# Patient Record
Sex: Male | Born: 1935 | Race: White | Hispanic: No | Marital: Married | Smoking: Former smoker
Health system: Southern US, Community
[De-identification: ages and names within clinical notes are randomized; demographics above are authoritative.]

## PROBLEM LIST (undated history)

## (undated) DIAGNOSIS — E785 Hyperlipidemia, unspecified: Secondary | ICD-10-CM

## (undated) DIAGNOSIS — I4891 Unspecified atrial fibrillation: Secondary | ICD-10-CM

## (undated) DIAGNOSIS — N19 Unspecified kidney failure: Secondary | ICD-10-CM

## (undated) DIAGNOSIS — E119 Type 2 diabetes mellitus without complications: Secondary | ICD-10-CM

## (undated) DIAGNOSIS — I10 Essential (primary) hypertension: Secondary | ICD-10-CM

## (undated) HISTORY — DX: Type 2 diabetes mellitus without complications: E11.9

## (undated) HISTORY — DX: Essential (primary) hypertension: I10

## (undated) HISTORY — DX: Unspecified kidney failure: N19

## (undated) HISTORY — PX: CORONARY ANGIOPLASTY WITH STENT PLACEMENT: SHX49

## (undated) HISTORY — PX: TURP VAPORIZATION: SUR1397

## (undated) HISTORY — DX: Hyperlipidemia, unspecified: E78.5

---

## 2001-12-03 ENCOUNTER — Encounter: Payer: Self-pay | Admitting: Emergency Medicine

## 2001-12-03 ENCOUNTER — Emergency Department (HOSPITAL_COMMUNITY): Admission: EM | Admit: 2001-12-03 | Discharge: 2001-12-03 | Payer: Self-pay

## 2013-02-02 ENCOUNTER — Emergency Department (INDEPENDENT_AMBULATORY_CARE_PROVIDER_SITE_OTHER): Payer: Medicare Other

## 2013-02-02 ENCOUNTER — Encounter (HOSPITAL_COMMUNITY): Payer: Self-pay | Admitting: Emergency Medicine

## 2013-02-02 ENCOUNTER — Emergency Department (INDEPENDENT_AMBULATORY_CARE_PROVIDER_SITE_OTHER)
Admission: EM | Admit: 2013-02-02 | Discharge: 2013-02-02 | Disposition: A | Discharge status: Home / Self Care | Payer: Medicare Other | Source: Home / Self Care | Attending: Family Medicine | Admitting: Family Medicine

## 2013-02-02 DIAGNOSIS — J986 Disorders of diaphragm: Secondary | ICD-10-CM

## 2013-02-02 DIAGNOSIS — J069 Acute upper respiratory infection, unspecified: Secondary | ICD-10-CM

## 2013-02-02 HISTORY — DX: Unspecified atrial fibrillation: I48.91

## 2013-02-02 MED ORDER — AZITHROMYCIN 250 MG PO TABS: 250.0000 mg | ORAL_TABLET | Freq: Every day | ORAL | Status: DC

## 2013-02-02 MED ORDER — PROMETHAZINE-CODEINE 6.25-10 MG/5ML PO SYRP: 5.0000 mL | ORAL_SOLUTION | Freq: Four times a day (QID) | ORAL | Status: DC | PRN

## 2013-02-02 MED ORDER — IPRATROPIUM BROMIDE 0.03 % NA SOLN: 2.0000 | Freq: Two times a day (BID) | NASAL | Status: DC

## 2013-02-02 NOTE — ED Provider Notes (Signed)
Terrence [name] is a 79 y.o. male who presents to Urgent Care today for sore throat hoarseness and cough. This is been present for 2 days. Patient denies any significant shortness of breath. He denies any fevers or chills nausea vomiting or diarrhea. He feels well otherwise. He has tried 325 mg of Tylenol which did not help much. He denies any history of chest or neck surgery. He denies any fevers or chill. He feels well otherwise.  No history of right hemidiaphragm.    Past Medical History  Diagnosis Date  . Atrial fibrillation    History  Substance Use Topics  . Smoking status: Never Smoker   . Smokeless tobacco: Not on file  . Alcohol Use: No   ROS as above Medications reviewed. Patient takes warfarin No current facility-administered medications for this encounter.   Current Outpatient Prescriptions  Medication Sig Dispense Refill  . azithromycin (ZITHROMAX) 250 MG tablet Take 1 tablet (250 mg total) by mouth daily. Take first 2 tablets together, then 1 every day until finished.  6 tablet  0  . ipratropium (ATROVENT) 0.03 % nasal spray Place 2 sprays into the nose every 12 (twelve) hours.  30 mL  1  . promethazine-codeine (PHENERGAN WITH CODEINE) 6.25-10 MG/5ML syrup Take 5 mLs by mouth every 6 (six) hours as needed for cough.  120 mL  0    Exam:  BP 129/62  Pulse 82  Temp(Src) 98.2 F (36.8 C) (Oral)  Resp 19  SpO2 96% Gen: Well NAD HEENT: EOMI,  MMM, posterior and posterior pharynx. Normal tympanic membranes bilateral Lungs: Normal work of breathing. Decreased breath sounds right base normal otherwise. Heart: Irregularly irregular. No murmurs rubs or gallops. Abd: NABS, NT, ND Exts: Non edematous BL  LE, warm and well perfused.   No results found for this or any previous visit (from the past 24 hour(s)). Dg Chest 2 View  02/02/2013   CLINICAL DATA:  Cough, congestion  EXAM: CHEST  2 VIEW  COMPARISON:  None.  FINDINGS: Cardiomediastinal silhouette is unremarkable.  Significant elevation of the right hemidiaphragm. Mild right basilar atelectasis. No segmental infiltrate or pulmonary edema. Atherosclerotic calcifications of thoracic aorta.  IMPRESSION: No active disease. Significant elevation of the right hemidiaphragm with right basilar atelectasis. Atherosclerotic calcifications of thoracic aorta.   Electronically Signed   By: Natasha Mead M.D.   On: 02/02/2013 14:54    Assessment and Plan: 79 y.o. male with probable viral URI. Viral illnesses the most likely explanation of hoarseness nasal congestion and cough. However patient has a right hemidiaphragm. This is likely an incidental finding. I discussed the case with the patient's primary care provider who has no record of a previous x-ray.  Patient is clinically stable. He will followup with his primary care provider on Monday for further evaluation of his right hemidiaphragm. Additionally the meantime we'll treat his symptoms with Atrovent nasal spray azithromycin and codeine containing cough medication.   He'll present to the emergency room if he worsens.  Discussed warning signs or symptoms. Please see discharge instructions. Patient expresses understanding.     Rodolph Bong, MD 02/02/13 [phone]

## 2013-02-02 NOTE — ED Notes (Signed)
Pt c/o cold sxs onset Tuesday  Sxs include: hoareness, ST, decreased sleep due to productive cough Denies: SOB, wheezing, f/v/n/d Alert w/no signs of acute distress.

## 2013-02-26 ENCOUNTER — Ambulatory Visit (INDEPENDENT_AMBULATORY_CARE_PROVIDER_SITE_OTHER): Payer: Medicare Other | Admitting: Podiatry

## 2013-02-26 ENCOUNTER — Encounter: Payer: Self-pay | Admitting: Podiatry

## 2013-02-26 VITALS — BP 125/64 | HR 67 | Resp 12 | Ht 74.0 in | Wt 268.0 lb

## 2013-02-26 DIAGNOSIS — B351 Tinea unguium: Secondary | ICD-10-CM

## 2013-02-26 DIAGNOSIS — M79609 Pain in unspecified limb: Secondary | ICD-10-CM

## 2013-02-26 NOTE — Progress Notes (Signed)
Subjective:     Patient ID: Terrence [name], male   DOB: [birth year], 79 y.o.   MRN: [id number]  HPI patient is found to have nail disease with thickness and pain 1-5 both feet   Review of Systems     Objective:   Physical Exam Neurovascular status intact with thick nail disease and pain 1-5 both feet    Assessment:     Mycotic nail infection with pain 1-5 both feet    Plan:     Debridement painful nailbeds 1-5 both feet

## 2013-03-30 ENCOUNTER — Encounter: Payer: Self-pay | Admitting: Internal Medicine

## 2013-03-30 ENCOUNTER — Ambulatory Visit (INDEPENDENT_AMBULATORY_CARE_PROVIDER_SITE_OTHER): Payer: Medicare Other | Admitting: Internal Medicine

## 2013-03-30 VITALS — BP 140/72 | HR 86 | Temp 98.0°F | Ht 73.0 in | Wt 269.0 lb

## 2013-03-30 DIAGNOSIS — R0989 Other specified symptoms and signs involving the circulatory and respiratory systems: Secondary | ICD-10-CM

## 2013-03-30 DIAGNOSIS — R0600 Dyspnea, unspecified: Secondary | ICD-10-CM

## 2013-03-30 DIAGNOSIS — J986 Disorders of diaphragm: Secondary | ICD-10-CM

## 2013-03-30 DIAGNOSIS — R0609 Other forms of dyspnea: Secondary | ICD-10-CM

## 2013-03-30 DIAGNOSIS — I10 Essential (primary) hypertension: Secondary | ICD-10-CM

## 2013-03-30 MED ORDER — AMLODIPINE-OLMESARTAN 5-20 MG PO TABS: 1.0000 | ORAL_TABLET | Freq: Every day | ORAL | Status: AC

## 2013-03-30 NOTE — Patient Instructions (Addendum)
Please see patient coordinator before you leave today  to schedule fluoroscopy   Stop vasotec and amlopine   Start azor 5/20 one daily instead  Weight control is simply a matter of calorie balance which needs to be tilted in your favor by eating less and exercising more.  To get the most out of exercise, you need to be continuously aware that you are short of breath, but never out of breath, for 30 minutes daily. As you improve, it will actually be easier for you to do the same amount of exercise  in  30 minutes so always push to the level where you are short of breath.  If this does not result in gradual weight reduction then I strongly recommend you see a nutritionist with a food diary x 2 weeks so that we can work out a negative calorie balance which is universally effective in steady weight loss programs.  Think of your calorie balance like you do your bank account where in this case you want the balance to go down so you must take in less calories than you burn up.  It's just that simple:  Hard to do, but easy to understand.  Good luck!   Please schedule a follow up office visit in 6 weeks, call sooner if needed with pfts

## 2013-03-30 NOTE — Progress Notes (Signed)
   Subjective:    Patient ID: Terrence [name], male    DOB: Feb 19, 1927   MRN: [id number]  HPI  [age] yowm quit smoking 1969 around 200 lbs first aware of sob around 1998 eval by [facility] [place] [facility] 2009 with ? Eventration but has progressed and therefore referred 03/30/2013 by Dr Jenell MillinerKip Carrington.  03/30/2013 1st [facility] office visit/ Denessa Cavan cc progessive doe x over a decade from 20 min mile to point of doe x 150 ft. No resting or noct symptoms.  Onset was insidious but progressive, no sudden changes. Does not recall any details from previous pulmonary w/u for same including no comment on significance of abn cxr though reports from 2009 including CT chest all indicate the same problem (elevated R HD)   No obvious day to day or daytime variabilty or assoc  cp or chest tightness, subjective wheeze overt sinus or hb symptoms. No unusual exp hx or h/o childhood pna/ asthma or knowledge of premature birth.  Sleeping ok without nocturnal  or early am exacerbation  of respiratory  c/o's or need for noct saba. Also denies any obvious fluctuation of symptoms with weather or environmental changes or other aggravating or alleviating factors except as outlined above   Current Medications, Allergies, Complete Past Medical History, Past Surgical History, Family History, and Social History were reviewed in [facility] electronic medical record.     Review of Systems  Constitutional: Negative for fever, chills, activity change, appetite change and unexpected weight change.  HENT: Negative for congestion, dental problem, postnasal drip, rhinorrhea, sneezing, sore throat, trouble swallowing and voice change.   Eyes: Negative for visual disturbance.  Respiratory: Positive for cough and shortness of breath. Negative for choking.   Cardiovascular: Negative for chest pain and leg swelling.  Gastrointestinal: Negative for nausea, vomiting and abdominal pain.  Genitourinary: Negative for difficulty urinating.   Musculoskeletal: Negative for arthralgias.  Skin: Negative for rash.  Psychiatric/Behavioral: Negative for behavioral problems and confusion.       Objective:   Physical Exam  Wt Readings from Last 3 Encounters:  03/30/13 269 lb (122.018 kg)  02/26/13 268 lb (121.564 kg)       Hoarse amb wm nad  HEENT: nl dentition, turbinates, and orophanx. Nl external ear canals without cough reflex   NECK :  without JVD/Nodes/TM/ nl carotid upstrokes bilaterally   LUNGS:   decreased BS R  base    CV:  RRR  no s3 or murmur or increase in P2, no edema   ABD:  soft and nontender with nl excursion in the supine position. No bruits or organomegaly, bowel sounds nl  MS:  warm without deformities, calf tenderness, cyanosis or clubbing  SKIN: warm and dry without lesions    NEURO:  alert, approp, no deficits      02/02/13  No active disease. Significant elevation of the right hemidiaphragm  with right basilar atelectasis. Atherosclerotic calcifications of  thoracic aorta.       Assessment & Plan:

## 2013-04-01 DIAGNOSIS — I10 Essential (primary) hypertension: Secondary | ICD-10-CM | POA: Insufficient documentation

## 2013-04-01 DIAGNOSIS — R0600 Dyspnea, unspecified: Secondary | ICD-10-CM | POA: Insufficient documentation

## 2013-04-01 NOTE — Assessment & Plan Note (Signed)
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  If he has any paradoxical movement he may benefit from surgery to stiffen the R diaphram.  Wt loss also encouraged.   See instructions for specific recommendations which were reviewed directly with the patient who was given a copy with highlighter outlining the key components.

## 2013-04-01 NOTE — Assessment & Plan Note (Signed)
03/30/2013  Walked RA x 1 laps @ 185 ft each stopped due to  desat to 86%   This degree of symptoms / desat not typical for just a chronically elevated HD > rec w/u with CTa

## 2013-04-01 NOTE — Assessment & Plan Note (Signed)
ACE inhibitors are problematic in  pts with airway complaints because  even experienced pulmonologists can't always distinguish ace effects from copd/asthma.  By themselves they don't actually cause a problem, much like oxygen can't by itself start a fire, but they certainly serve as a powerful catalyst or enhancer for any "fire"  or inflammatory process in the upper airway, be it caused by an ET  tube or more commonly reflux (especially in the obese or pts with known GERD or who are on biphoshonates).    In the era of ARB near equivalency until we have a better handle on the reversibility of the airway problem, it just makes sense to avoid ACEI  entirely in the short run and then decide later, having established a level of airway control using a reasonable limited regimen, whether to add back ace but even then being very careful to observe the pt for worsening airway control and number of meds used/ needed to control symptoms.    For now try azor 5/20 in place of vasotec/amlodipine until have a chance to sort out the various potential causes of his symptoms

## 2013-04-02 ENCOUNTER — Ambulatory Visit (HOSPITAL_COMMUNITY)
Admission: RE | Admit: 2013-04-02 | Discharge: 2013-04-02 | Disposition: A | Discharge status: Home / Self Care | Payer: Medicare Other | Source: Ambulatory Visit | Attending: Internal Medicine | Admitting: Internal Medicine

## 2013-04-02 ENCOUNTER — Encounter: Payer: Self-pay | Admitting: Internal Medicine

## 2013-04-02 ENCOUNTER — Telehealth: Payer: Self-pay | Admitting: *Deleted

## 2013-04-02 DIAGNOSIS — J986 Disorders of diaphragm: Secondary | ICD-10-CM | POA: Insufficient documentation

## 2013-04-02 NOTE — Telephone Encounter (Signed)
Spoke with the pt's spouse and notified of recs per MW She verbalized understanding.

## 2013-04-02 NOTE — Telephone Encounter (Signed)
Message copied by Christen ButterASKIN, Colinda Barth M on Mon Apr 02, 2013  9:15 AM ------      Message from: Nyoka CowdenWERT, MICHAEL B      Created: Sun Apr 01, 2013  6:42 AM       After reviewing his records, he needs labs and a CTa of chest also to complete the w/u (ordered but not notiifed) ------

## 2013-04-03 ENCOUNTER — Other Ambulatory Visit (INDEPENDENT_AMBULATORY_CARE_PROVIDER_SITE_OTHER): Payer: Medicare Other

## 2013-04-03 DIAGNOSIS — R0609 Other forms of dyspnea: Secondary | ICD-10-CM

## 2013-04-03 DIAGNOSIS — R0600 Dyspnea, unspecified: Secondary | ICD-10-CM

## 2013-04-03 DIAGNOSIS — R0989 Other specified symptoms and signs involving the circulatory and respiratory systems: Secondary | ICD-10-CM

## 2013-04-03 LAB — CBC WITH DIFFERENTIAL/PLATELET
Basophils Absolute: 0 10*3/uL (ref 0.0–0.1)
Basophils Relative: 0.2 % (ref 0.0–3.0)
EOS ABS: 0.3 10*3/uL (ref 0.0–0.7)
Eosinophils Relative: 3.2 % (ref 0.0–5.0)
HCT: 42 % (ref 39.0–52.0)
Hemoglobin: 13.9 g/dL (ref 13.0–17.0)
Lymphocytes Relative: 13.6 % (ref 12.0–46.0)
Lymphs Abs: 1.4 10*3/uL (ref 0.7–4.0)
MCHC: 33.2 g/dL (ref 30.0–36.0)
MCV: 91 fl (ref 78.0–100.0)
MONO ABS: 1.1 10*3/uL — AB (ref 0.1–1.0)
Monocytes Relative: 10.2 % (ref 3.0–12.0)
NEUTROS ABS: 7.5 10*3/uL (ref 1.4–7.7)
NEUTROS PCT: 72.8 % (ref 43.0–77.0)
Platelets: 167 10*3/uL (ref 150.0–400.0)
RBC: 4.62 Mil/uL (ref 4.22–5.81)
RDW: 15.6 % — ABNORMAL HIGH (ref 11.5–14.6)
WBC: 10.3 10*3/uL (ref 4.5–10.5)

## 2013-04-03 LAB — BASIC METABOLIC PANEL
BUN: 27 mg/dL — ABNORMAL HIGH (ref 6–23)
CHLORIDE: 103 meq/L (ref 96–112)
CO2: 27 meq/L (ref 19–32)
CREATININE: 1.8 mg/dL — AB (ref 0.4–1.5)
Calcium: 9 mg/dL (ref 8.4–10.5)
GFR: 37.42 mL/min — ABNORMAL LOW (ref >60.00)
Glucose, Bld: 110 mg/dL — ABNORMAL HIGH (ref 70–99)
Potassium: 4.4 mEq/L (ref 3.5–5.1)
Sodium: 137 mEq/L (ref 135–145)

## 2013-04-03 LAB — TSH: TSH: 1.98 u[IU]/mL (ref 0.35–5.50)

## 2013-04-03 LAB — BRAIN NATRIURETIC PEPTIDE: PRO B NATRI PEPTIDE: 221 pg/mL — AB (ref 0.0–100.0)

## 2013-04-03 NOTE — Progress Notes (Signed)
Quick Note:  Spoke with pt and notified of results per Dr. Wert. Pt verbalized understanding and denied any questions.  ______ 

## 2013-04-05 ENCOUNTER — Ambulatory Visit (INDEPENDENT_AMBULATORY_CARE_PROVIDER_SITE_OTHER)
Admission: RE | Admit: 2013-04-05 | Discharge: 2013-04-05 | Disposition: A | Discharge status: Home / Self Care | Payer: Medicare Other | Source: Ambulatory Visit | Attending: Internal Medicine | Admitting: Internal Medicine

## 2013-04-05 ENCOUNTER — Encounter: Payer: Self-pay | Admitting: Internal Medicine

## 2013-04-05 DIAGNOSIS — R0989 Other specified symptoms and signs involving the circulatory and respiratory systems: Secondary | ICD-10-CM

## 2013-04-05 DIAGNOSIS — R0609 Other forms of dyspnea: Secondary | ICD-10-CM

## 2013-04-05 DIAGNOSIS — K8020 Calculus of gallbladder without cholecystitis without obstruction: Secondary | ICD-10-CM | POA: Insufficient documentation

## 2013-04-05 DIAGNOSIS — R0600 Dyspnea, unspecified: Secondary | ICD-10-CM

## 2013-04-05 MED ORDER — IOHEXOL 300 MG/ML  SOLN
65.0000 mL | Freq: Once | INTRAMUSCULAR | Status: AC | PRN
  Administered 2013-04-05: 65 mL via INTRAVENOUS

## 2013-04-05 NOTE — Progress Notes (Signed)
Quick Note:  Spoke with pt and notified of results per Dr. Wert. Pt verbalized understanding and denied any questions.  ______ 

## 2013-04-10 ENCOUNTER — Telehealth: Payer: Self-pay | Admitting: Internal Medicine

## 2013-04-10 DIAGNOSIS — R0600 Dyspnea, unspecified: Secondary | ICD-10-CM

## 2013-04-10 NOTE — Telephone Encounter (Signed)
Just make sure bp ok on the new med before he goes and pace himself when walking and work on wt loss - we need to regoup when he returns to talk about imited longterm management options

## 2013-04-10 NOTE — Telephone Encounter (Signed)
Called and spoke with pt and he stated that he is scheduled to go on a cruise and he wanted to check with MW to make sure he thinks the pt will be ok to do this. Pt stated that he is still having the same problems from his OV with MW but no new problems.  He stated that he is just not able to walk very far without being out of breath.  MW please advise. Thanks  No Known Allergies   Current Outpatient Prescriptions on File Prior to Visit  Medication Sig Dispense Refill  . amLODipine-olmesartan (AZOR) 5-20 MG per tablet Take 1 tablet by mouth daily.      Marland Kitchen. ammonium lactate (LAC-HYDRIN) 12 % lotion As directed      . atorvastatin (LIPITOR) 40 MG tablet 40 mg.      . diazepam (VALIUM) 5 MG tablet 5 mg.      . ezetimibe (ZETIA) 10 MG tablet 10 mg.      . finasteride (PROSCAR) 5 MG tablet 5 mg.      . furosemide (LASIX) 20 MG tablet 20 mg.      . glucose blood (ONE TOUCH ULTRA TEST) test strip 1 each.      . insulin aspart (NOVOLOG) 100 UNIT/ML injection Inject before meals as follows: 16 units before breakfast, 12 at lunch and  38 at supper      . insulin glargine (LANTUS) 100 UNIT/ML injection Inject 50 Units into the skin at bedtime.      . Insulin Pen Needle (B-D ULTRAFINE III SHORT PEN) 31G X 8 MM MISC 1 each.      Marland Kitchen. ipratropium (ATROVENT) 0.03 % nasal spray Place 2 sprays into the nose every 12 (twelve) hours.  30 mL  1  . isosorbide mononitrate (IMDUR) 60 MG 24 hr tablet 60 mg.      . lansoprazole (PREVACID) 30 MG capsule 30 mg.      . metoprolol succinate (TOPROL XL) 50 MG 24 hr tablet 50 mg.      . metoprolol succinate (TOPROL-XL) 100 MG 24 hr tablet Take 1 tablet by mouth daily. Total of 150 mg a day      . nitroGLYCERIN (NITROSTAT) 0.4 MG SL tablet 0.4 mg.      . ONETOUCH DELICA LANCETS 33G MISC 1 each.      . tamsulosin (FLOMAX) 0.4 MG CAPS capsule 0.4 mg.      . warfarin (COUMADIN) 1 MG tablet As directed       No current facility-administered medications on file prior to visit.

## 2013-04-10 NOTE — Telephone Encounter (Signed)
lmomtcb x1 

## 2013-04-11 NOTE — Telephone Encounter (Signed)
Pt aware. Kellyann Ordway, CMA  

## 2013-05-11 ENCOUNTER — Encounter: Payer: Self-pay | Admitting: Internal Medicine

## 2013-05-11 ENCOUNTER — Ambulatory Visit (INDEPENDENT_AMBULATORY_CARE_PROVIDER_SITE_OTHER): Payer: Medicare Other | Admitting: Internal Medicine

## 2013-05-11 ENCOUNTER — Other Ambulatory Visit (INDEPENDENT_AMBULATORY_CARE_PROVIDER_SITE_OTHER): Payer: Medicare Other

## 2013-05-11 VITALS — BP 129/60 | HR 63 | Temp 97.9°F | Ht 74.0 in | Wt 270.0 lb

## 2013-05-11 DIAGNOSIS — R0609 Other forms of dyspnea: Secondary | ICD-10-CM

## 2013-05-11 DIAGNOSIS — J986 Disorders of diaphragm: Secondary | ICD-10-CM

## 2013-05-11 DIAGNOSIS — I10 Essential (primary) hypertension: Secondary | ICD-10-CM

## 2013-05-11 DIAGNOSIS — R0600 Dyspnea, unspecified: Secondary | ICD-10-CM

## 2013-05-11 DIAGNOSIS — R0989 Other specified symptoms and signs involving the circulatory and respiratory systems: Secondary | ICD-10-CM

## 2013-05-11 LAB — BASIC METABOLIC PANEL
BUN: 23 mg/dL (ref 6–23)
CO2: 25 mEq/L (ref 19–32)
Calcium: 8.9 mg/dL (ref 8.4–10.5)
Chloride: 111 mEq/L (ref 96–112)
Creatinine, Ser: 1.7 mg/dL — ABNORMAL HIGH (ref 0.4–1.5)
GFR: 40.71 mL/min — AB (ref >60.00)
Glucose, Bld: 137 mg/dL — ABNORMAL HIGH (ref 70–99)
POTASSIUM: 4.3 meq/L (ref 3.5–5.1)
SODIUM: 142 meq/L (ref 135–145)

## 2013-05-11 NOTE — Patient Instructions (Signed)
Weight control is simply a matter of calorie balance which needs to be tilted in your favor by eating less and exercising more.  To get the most out of exercise, you need to be continuously aware that you are short of breath, but never out of breath, for 30 minutes daily. As you improve, it will actually be easier for you to do the same amount of exercise  in  30 minutes so always push to the level where you are short of breath.  If this does not result in gradual weight reduction then I strongly recommend you see a nutritionist with a food diary x 2 weeks so that we can work out a negative calorie balance which is universally effective in steady weight loss programs.  Think of your calorie balance like you do your bank account where in this case you want the balance to go down so you must take in less calories than you burn up.  It's just that simple:  Hard to do, but easy to understand.  Good luck!   Please remember to go to the lab  department downstairs for your tests - we will call you with the results when they are available.

## 2013-05-11 NOTE — Progress Notes (Signed)
Quick Note:  Spoke with pt and notified of results per Dr. Wert. Pt verbalized understanding and denied any questions.  ______ 

## 2013-05-11 NOTE — Progress Notes (Signed)
Subjective:    Patient ID: Terrence [name], male    DOB: [birth year]   MRN: [id number]  Brief patient profile:  43 yowm quit smoking 1969 around 200 lbs first aware of sob around 1998 eval by [facility] 2009 with ? Eventration but has progressed and therefore referred 03/30/2013 by Dr Jenell Milliner.   History of Present Illness  03/30/2013 1st [place] [place] [facility] office visit/ Terrence [name] cc progessive doe x over a decade from 20 min mile to point of doe x 150 ft. No resting or noct symptoms.  Onset was insidious but progressive, no sudden changes. Does not recall any details from previous pulmonary w/u for same including no comment on significance of abn cxr though reports from 2009 including CT chest all indicate the same problem (elevated R HD)  rec Stop vasotec and amlodipine  Start azor 5/20 one daily instead Weight control   05/11/2013 f/u ov/Terrence [name] re: doe / paralyzed R HD  Chief Complaint  Patient presents with  . Follow-up    Pt states that his breathing is unchanged since the last visit. No new co's today.   does not have 02 - has treadmill but not using  Able to lie down at 10 degrees comfortably   No obvious day to day or daytime variabilty or assoc chronic cough or cp or chest tightness, subjective wheeze overt sinus or hb symptoms. No unusual exp hx or h/o childhood pna/ asthma or knowledge of premature birth.  Sleeping ok without nocturnal  or early am exacerbation  of respiratory  c/o's or need for noct saba. Also denies any obvious fluctuation of symptoms with weather or environmental changes or other aggravating or alleviating factors except as outlined above   Current Medications, Allergies, Complete Past Medical History, Past Surgical History, Family History, and Social History were reviewed in [employer] record.  ROS  The following are not active complaints unless bolded sore throat, dysphagia, dental problems, itching, sneezing,  nasal  congestion or excess/ purulent secretions, ear ache,   fever, chills, sweats, unintended wt loss, pleuritic or exertional cp, hemoptysis,  orthopnea pnd or leg swelling, presyncope, palpitations, heartburn, abdominal pain, anorexia, nausea, vomiting, diarrhea  or change in bowel or urinary habits, change in stools or urine, dysuria,hematuria,  rash, arthralgias, visual complaints, headache, numbness weakness or ataxia or problems with walking or coordination,  change in mood/affect or memory.                 Objective:   Physical Exam  Wt Readings from Last 3 Encounters:  05/11/13 270 lb (122.471 kg)  03/30/13 269 lb (122.018 kg)  02/26/13 268 lb (121.564 kg)          Hoarse amb wm nad  HEENT: nl dentition, turbinates, and orophanx. Nl external ear canals without cough reflex   NECK :  without JVD/Nodes/TM/ nl carotid upstrokes bilaterally   LUNGS:   decreased BS R  base    CV:  RRR  no s3 or murmur or increase in P2, no edema   ABD:  soft and nontender with nl excursion in the supine position. No bruits or organomegaly, bowel sounds nl  MS:  warm without deformities, calf tenderness, cyanosis or clubbing  SKIN: warm and dry without lesions    NEURO:  alert, approp, no deficits      02/02/13  No active disease. Significant elevation of the right hemidiaphragm  with right basilar atelectasis. Atherosclerotic calcifications of  thoracic aorta.  Assessment & Plan:   Outpatient Encounter Prescriptions as of 05/11/2013  Medication Sig  . amLODipine-olmesartan (AZOR) 5-20 MG per tablet Take 1 tablet by mouth daily.  Marland Kitchen. ammonium lactate (LAC-HYDRIN) 12 % lotion As directed  . atorvastatin (LIPITOR) 40 MG tablet 40 mg.  . diazepam (VALIUM) 5 MG tablet 5 mg.  . ezetimibe (ZETIA) 10 MG tablet 10 mg.  . finasteride (PROSCAR) 5 MG tablet 5 mg.  . furosemide (LASIX) 20 MG tablet Take 20 mg by mouth daily.   Marland Kitchen. glucose blood (ONE TOUCH ULTRA TEST) test strip 1 each.   . insulin aspart (NOVOLOG) 100 UNIT/ML injection Inject before meals as follows: 16 units before breakfast, 12 at lunch and  38 at supper  . insulin glargine (LANTUS) 100 UNIT/ML injection Inject 50 Units into the skin at bedtime.  . Insulin Pen Needle (B-D ULTRAFINE III SHORT PEN) 31G X 8 MM MISC 1 each.  . isosorbide mononitrate (IMDUR) 60 MG 24 hr tablet 60 mg.  . lansoprazole (PREVACID) 30 MG capsule 30 mg.  . metoprolol succinate (TOPROL XL) 50 MG 24 hr tablet 50 mg.  . metoprolol succinate (TOPROL-XL) 100 MG 24 hr tablet Take 1 tablet by mouth daily. Total of 150 mg a day  . nitroGLYCERIN (NITROSTAT) 0.4 MG SL tablet 0.4 mg.  . ONETOUCH DELICA LANCETS 33G MISC 1 each.  . tamsulosin (FLOMAX) 0.4 MG CAPS capsule 0.4 mg.  . warfarin (COUMADIN) 1 MG tablet As directed  . [DISCONTINUED] ipratropium (ATROVENT) 0.03 % nasal spray Place 2 sprays into the nose every 12 (twelve) hours.

## 2013-05-12 ENCOUNTER — Encounter: Payer: Self-pay | Admitting: Internal Medicine

## 2013-05-12 NOTE — Assessment & Plan Note (Signed)
Dx 12/21/2007 CT = R eventration  ([facility]  [place]) - Sniff test 04/02/2013 > POS paradox  - CTa 04/05/2013 >> No evidence of acute cardiopulmonary disease. 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

## 2013-05-12 NOTE — Assessment & Plan Note (Signed)
Lab Results  Component Value Date   CREATININE 1.7* 05/11/2013   CREATININE 1.8* 04/03/2013     Ok to continue azor as pt prefers one drug instead of 2 (though ok to resume acei as well)   Pulmonary f/u is prn

## 2013-05-12 NOTE — Assessment & Plan Note (Addendum)
03/30/2013  Walked RA x 1 laps @ 185 ft each stopped due to  desat to 86%   .- Spirometry 05/11/2013 c/w mod/severe restriction only  Explained by obesity/ deconditioning and R HD paralysis > rx is reconditioning/ wt loss, reviewed At this point declined amb 02 though note he did qualify for it

## 2013-05-28 ENCOUNTER — Encounter: Payer: Self-pay | Admitting: Podiatry

## 2013-05-28 ENCOUNTER — Ambulatory Visit (INDEPENDENT_AMBULATORY_CARE_PROVIDER_SITE_OTHER): Payer: Medicare Other | Admitting: Podiatry

## 2013-05-28 VITALS — BP 132/86 | HR 86 | Resp 12

## 2013-05-28 DIAGNOSIS — M79609 Pain in unspecified limb: Secondary | ICD-10-CM

## 2013-05-28 DIAGNOSIS — B351 Tinea unguium: Secondary | ICD-10-CM

## 2013-05-29 NOTE — Progress Notes (Signed)
Subjective:     Patient ID: Terrence [name], male   DOB: 1926/10/07, 80 y.o.   MRN: [id number]  HPI patient has nail disease with thickness 1-5 both feet   Review of Systems     Objective:   Physical Exam Neurovascular status intact with thick nailbeds 1-5 both feet that are painful    Assessment:     Mycotic nail infection with pain 1-5 both feet    Plan:     Debridement painful nailbeds 1-5 both feet with no bleeding noted

## 2013-08-27 ENCOUNTER — Ambulatory Visit: Payer: Federal, State, Local not specified - PPO | Admitting: Podiatry

## 2013-08-30 ENCOUNTER — Ambulatory Visit (INDEPENDENT_AMBULATORY_CARE_PROVIDER_SITE_OTHER): Payer: Medicare Other | Admitting: Podiatry

## 2013-08-30 VITALS — BP 139/58 | HR 71 | Resp 16

## 2013-08-30 DIAGNOSIS — B351 Tinea unguium: Secondary | ICD-10-CM

## 2013-08-30 DIAGNOSIS — M79609 Pain in unspecified limb: Secondary | ICD-10-CM

## 2013-08-31 NOTE — Progress Notes (Signed)
Subjective:     Patient ID: Terrence [name], male   DOB: Jun 07, 1926, 80 y.o.   MRN: [id number]  HPI patient is found to have thick nailbeds there is yellow and brittle 1-5 both feet and painful   Review of Systems     Objective:   Physical Exam Neurovascular status intact with incurvated thick yellow brittle nailbeds 1-5 both feet that are painful    Assessment:     Mycotic nail infection with pain 1-5 both feet    Plan:     Debridement painful nailbeds 1-5 both feet with no iatrogenic bleeding noted

## 2013-12-06 ENCOUNTER — Other Ambulatory Visit: Payer: Federal, State, Local not specified - PPO

## 2013-12-10 ENCOUNTER — Ambulatory Visit (INDEPENDENT_AMBULATORY_CARE_PROVIDER_SITE_OTHER): Payer: Medicare Other | Admitting: Podiatry

## 2013-12-10 DIAGNOSIS — M79673 Pain in unspecified foot: Secondary | ICD-10-CM

## 2013-12-10 DIAGNOSIS — B351 Tinea unguium: Secondary | ICD-10-CM

## 2013-12-10 DIAGNOSIS — M79609 Pain in unspecified limb: Secondary | ICD-10-CM

## 2013-12-10 NOTE — Patient Instructions (Signed)
Diabetes and Foot Care Diabetes may cause you to have problems because of poor blood supply (circulation) to your feet and legs. This may cause the skin on your feet to become thinner, break easier, and heal more slowly. Your skin may become dry, and the skin may peel and crack. You may also have nerve damage in your legs and feet causing decreased feeling in them. You may not notice minor injuries to your feet that could lead to infections or more serious problems. Taking care of your feet is one of the most important things you can do for yourself.  HOME CARE INSTRUCTIONS  Wear shoes at all times, even in the house. Do not go barefoot. Bare feet are easily injured.  Check your feet daily for blisters, cuts, and redness. If you cannot see the bottom of your feet, use a mirror or ask someone for help.  Wash your feet with warm water (do not use hot water) and mild soap. Then pat your feet and the areas between your toes until they are completely dry. Do not soak your feet as this can dry your skin.  Apply a moisturizing lotion or petroleum jelly (that does not contain alcohol and is unscented) to the skin on your feet and to dry, brittle toenails. Do not apply lotion between your toes.  Trim your toenails straight across. Do not dig under them or around the cuticle. File the edges of your nails with an emery board or nail file.  Do not cut corns or calluses or try to remove them with medicine.  Wear clean socks or stockings every day. Make sure they are not too tight. Do not wear knee-high stockings since they may decrease blood flow to your legs.  Wear shoes that fit properly and have enough cushioning. To break in new shoes, wear them for just a few hours a day. This prevents you from injuring your feet. Always look in your shoes before you put them on to be sure there are no objects inside.  Do not cross your legs. This may decrease the blood flow to your feet.  If you find a minor scrape,  cut, or break in the skin on your feet, keep it and the skin around it clean and dry. These areas may be cleansed with mild soap and water. Do not cleanse the area with peroxide, alcohol, or iodine.  When you remove an adhesive bandage, be sure not to damage the skin around it.  If you have a wound, look at it several times a day to make sure it is healing.  Do not use heating pads or hot water bottles. They may burn your skin. If you have lost feeling in your feet or legs, you may not know it is happening until it is too late.  Make sure your health care provider performs a complete foot exam at least annually or more often if you have foot problems. Report any cuts, sores, or bruises to your health care provider immediately. SEEK MEDICAL CARE IF:   You have an injury that is not healing.  You have cuts or breaks in the skin.  You have an ingrown nail.  You notice redness on your legs or feet.  You feel burning or tingling in your legs or feet.  You have pain or cramps in your legs and feet.  Your legs or feet are numb.  Your feet always feel cold. SEEK IMMEDIATE MEDICAL CARE IF:   There is increasing redness,   swelling, or pain in or around a wound.  There is a red line that goes up your leg.  Pus is coming from a wound.  You develop a fever or as directed by your health care provider.  You notice a bad smell coming from an ulcer or wound. Document Released: 03/12/2000 Document Revised: 11/15/2012 Document Reviewed: 08/22/2012 [employer] Patient Information 2015 [employer]. This information is not intended to replace advice given to you by your health care provider. Make sure you discuss any questions you have with your health care provider.  

## 2013-12-10 NOTE — Progress Notes (Signed)
   Subjective:    Patient ID: Terrence [name], male    DOB: 1926-05-11, 80 y.o.   MRN: [id number]  HPI  Pt presents for nail debridement Review of Systems     Objective:   Physical Exam        Assessment & Plan:

## 2013-12-11 NOTE — Progress Notes (Signed)
Subjective:     Patient ID: Terrence [name], male   DOB: Feb 14, 1927, 80 y.o.   MRN: [id number]  HPI patient presents with thick yellow brittle nailbeds 1-5 both feet that he cannot cut   Review of Systems     Objective:   Physical Exam Brittle yellow nailbeds 1-5 both feet that are thick and painful when pressed    Assessment:     Mycotic nail infection is with pain 1-5 both feet    Plan:     Debride painful nailbeds 1-5 both feet with no iatrogenic bleeding noted

## 2013-12-29 ENCOUNTER — Encounter (HOSPITAL_COMMUNITY): Payer: Self-pay | Admitting: Emergency Medicine

## 2013-12-29 ENCOUNTER — Emergency Department (HOSPITAL_COMMUNITY)
Admission: EM | Admit: 2013-12-29 | Discharge: 2013-12-29 | Disposition: A | Discharge status: Home / Self Care | Payer: Medicare Other | Attending: Emergency Medicine | Admitting: Emergency Medicine

## 2013-12-29 DIAGNOSIS — M25462 Effusion, left knee: Secondary | ICD-10-CM | POA: Diagnosis present

## 2013-12-29 DIAGNOSIS — Z87891 Personal history of nicotine dependence: Secondary | ICD-10-CM | POA: Insufficient documentation

## 2013-12-29 DIAGNOSIS — I10 Essential (primary) hypertension: Secondary | ICD-10-CM | POA: Insufficient documentation

## 2013-12-29 DIAGNOSIS — L03115 Cellulitis of right lower limb: Secondary | ICD-10-CM | POA: Diagnosis not present

## 2013-12-29 DIAGNOSIS — I4891 Unspecified atrial fibrillation: Secondary | ICD-10-CM | POA: Diagnosis not present

## 2013-12-29 DIAGNOSIS — Z794 Long term (current) use of insulin: Secondary | ICD-10-CM | POA: Insufficient documentation

## 2013-12-29 DIAGNOSIS — N19 Unspecified kidney failure: Secondary | ICD-10-CM | POA: Diagnosis not present

## 2013-12-29 DIAGNOSIS — Z79899 Other long term (current) drug therapy: Secondary | ICD-10-CM | POA: Insufficient documentation

## 2013-12-29 DIAGNOSIS — Z955 Presence of coronary angioplasty implant and graft: Secondary | ICD-10-CM | POA: Insufficient documentation

## 2013-12-29 DIAGNOSIS — E119 Type 2 diabetes mellitus without complications: Secondary | ICD-10-CM | POA: Diagnosis not present

## 2013-12-29 DIAGNOSIS — Z7901 Long term (current) use of anticoagulants: Secondary | ICD-10-CM | POA: Insufficient documentation

## 2013-12-29 DIAGNOSIS — E785 Hyperlipidemia, unspecified: Secondary | ICD-10-CM | POA: Insufficient documentation

## 2013-12-29 MED ORDER — CEPHALEXIN 500 MG PO CAPS: 500.0000 mg | ORAL_CAPSULE | Freq: Four times a day (QID) | ORAL | Status: DC

## 2013-12-29 NOTE — ED Provider Notes (Addendum)
CSN: [id number]     Arrival date & time 12/29/13  1355 History   First MD Initiated Contact with Patient 12/29/13 1440     Chief Complaint  Patient presents with  . Joint Swelling     (Consider location/radiation/quality/duration/timing/severity/associated sxs/prior Treatment) HPI Comments: Pt states he falls frequently from tripping and having wound care to the patellar area of the knee with improvement and healing wound but noticed redness to the lateral right knee and slight tenderness to the skin when bending the knee.  Patient is a 80 y.o. male presenting with rash. The history is provided by the patient.  Rash Location:  Leg Leg rash location:  R knee Quality: painful and redness   Pain details:    Quality:  Hot and stinging   Severity:  Mild   Onset quality:  Gradual   Duration:  3 days   Timing:  Constant   Progression:  Worsening Severity:  Mild Onset quality:  Gradual Timing:  Constant Progression:  Worsening Chronicity:  New Context comment:  Pt noticed redness to the skin on the lateral part of his right knee 2 or 3 days ago and slightly worse with some pain over the area when bending the knee Relieved by:  None tried Exacerbated by: bending the knee. Ineffective treatments:  None tried Associated symptoms: no fever, no nausea and not vomiting     Past Medical History  Diagnosis Date  . Atrial fibrillation   . Hypertension   . DM (diabetes mellitus)   . Kidney failure   . Hyperlipidemia    Past Surgical History  Procedure Laterality Date  . Turp vaporization    . Coronary angioplasty with stent placement     Family History  Problem Relation Age of Onset  . Heart disease Brother   . Heart disease Brother   . Colon cancer Father     with mets to liver   History  Substance Use Topics  . Smoking status: Former Smoker -- 1.25 packs/day for 22 years    Types: Cigarettes    Quit date: 03/30/1967  . Smokeless tobacco: Never Used  . Alcohol Use: No     Review of Systems  Constitutional: Negative for fever.  Gastrointestinal: Negative for nausea and vomiting.  Skin: Positive for rash.  All other systems reviewed and are negative.     Allergies  Review of patient's allergies indicates no known allergies.  Home Medications   Prior to Admission medications   Medication Sig Start Date End Date Taking? Authorizing Provider  amLODipine-olmesartan (AZOR) 5-20 MG per tablet Take 1 tablet by mouth daily. 03/30/13   Nyoka Cowden, MD  ammonium lactate (LAC-HYDRIN) 12 % lotion As directed 12/30/12   Historical Provider, MD  atorvastatin (LIPITOR) 40 MG tablet 40 mg. 06/01/12   Historical Provider, MD  ezetimibe (ZETIA) 10 MG tablet 10 mg. 03/20/12 05/11/13  Historical Provider, MD  finasteride (PROSCAR) 5 MG tablet 5 mg. 10/25/12   Historical Provider, MD  furosemide (LASIX) 20 MG tablet Take 20 mg by mouth daily.  11/07/12   Historical Provider, MD  insulin aspart (NOVOLOG) 100 UNIT/ML injection Inject before meals as follows: 16 units before breakfast, 12 at lunch and  38 at supper 11/03/12   Historical Provider, MD  insulin glargine (LANTUS) 100 UNIT/ML injection Inject 50 Units into the skin at bedtime. 05/30/12   Historical Provider, MD  isosorbide mononitrate (IMDUR) 60 MG 24 hr tablet 60 mg. 06/01/12   Historical Provider, MD  lansoprazole (PREVACID) 30 MG capsule 30 mg. 02/01/13 02/01/14  Historical Provider, MD  metoprolol succinate (TOPROL XL) 50 MG 24 hr tablet 50 mg. 10/25/12 10/25/13  Historical Provider, MD  metoprolol succinate (TOPROL-XL) 100 MG 24 hr tablet Take 1 tablet by mouth daily. Total of 150 mg a day 10/25/12   Historical Provider, MD  nitroGLYCERIN (NITROSTAT) 0.4 MG SL tablet 0.4 mg. 06/07/12 06/07/13  Historical Provider, MD  warfarin (COUMADIN) 1 MG tablet As directed 10/20/12   Historical Provider, MD   BP 165/70  Pulse 66  Temp(Src) 98.7 F (37.1 C) (Oral)  Resp 20  SpO2 98% Physical Exam  Nursing note and vitals  reviewed. Constitutional: He is oriented to person, place, and time. He appears well-developed and well-nourished. No distress.  HENT:  Head: Normocephalic and atraumatic.  Cardiovascular: Normal rate.   Pulmonary/Chest: Effort normal.  Musculoskeletal:       Legs: Neurological: He is alert and oriented to person, place, and time.  Psychiatric: He has a normal mood and affect. His behavior is normal.    ED Course  Procedures (including critical care time) Labs Review Labs Reviewed - No data to display  Imaging Review No results found.   EKG Interpretation None      MDM   Final diagnoses:  Cellulitis of right lower extremity    Patient here with 3 days of skin redness and mild pain concerning for cellulitis. No sign of septic joints or systemic infection at this time. No open wounds to the affected area. Will start patient on Keflex and have him followup with his PMD or return for worsening symptoms    Gwyneth SproutWhitney Yailene Badia, MD 12/29/13 1513  Gwyneth SproutWhitney Jarika Robben, MD 12/29/13 1515

## 2013-12-29 NOTE — ED Notes (Signed)
Pt has cut to knee that is now reddened and swollen; difficult to bend; can bear weight; diabetic. Symptoms started on Wednes.

## 2014-03-11 ENCOUNTER — Ambulatory Visit (INDEPENDENT_AMBULATORY_CARE_PROVIDER_SITE_OTHER): Payer: Medicare Other | Admitting: Podiatry

## 2014-03-11 DIAGNOSIS — M79673 Pain in unspecified foot: Secondary | ICD-10-CM

## 2014-03-11 DIAGNOSIS — B351 Tinea unguium: Secondary | ICD-10-CM

## 2014-03-11 NOTE — Progress Notes (Signed)
Subjective:     Patient ID: Terrence [name], male   DOB: [birth year], 80 y.o.   MRN: [id number]  HPI patient presents with thick yellow brittle nailbeds 1-5 both feet that he cannot cut   Review of Systems     Objective:   Physical Exam Brittle yellow nailbeds 1-5 both feet that are thick and painful when pressed    Assessment:     Mycotic nail infection is with pain 1-5 both feet    Plan:     Debride painful nailbeds 1-5 both feet with no iatrogenic bleeding noted

## 2014-05-06 ENCOUNTER — Encounter: Payer: Self-pay | Admitting: Podiatry

## 2014-05-06 ENCOUNTER — Ambulatory Visit (INDEPENDENT_AMBULATORY_CARE_PROVIDER_SITE_OTHER): Payer: Medicare Other | Admitting: Podiatry

## 2014-05-06 VITALS — BP 176/76 | HR 73 | Resp 16

## 2014-05-06 DIAGNOSIS — L03031 Cellulitis of right toe: Secondary | ICD-10-CM | POA: Diagnosis not present

## 2014-05-08 NOTE — Progress Notes (Signed)
Subjective:     Patient ID: Terrence [name], male   DOB: Sep 02, 1926, 81 y.o.   MRN: [id number]  HPI patient presents stating he scraped his right big toe and it was bruising and he's concerned because he is diabetic   Review of Systems     Objective:   Physical Exam Neurovascular status unchanged from previous visit with hair growth noted to be moderate and noted to have irritation of the right hallux distal portion with no indications of proximal edema erythema or drainage. No systemic indications of infection at the current time    Assessment:     Trauma to the right hallux with localized process and no indications of systemic infection    Plan:     Advised on soaks Band-Aid treatment and Neosporin. If any redness were to occur or swelling he is to let us know immediately

## 2014-06-10 ENCOUNTER — Ambulatory Visit: Payer: Medicare Other | Admitting: Podiatry

## 2014-06-27 ENCOUNTER — Ambulatory Visit (INDEPENDENT_AMBULATORY_CARE_PROVIDER_SITE_OTHER): Payer: Medicare Other | Admitting: Podiatry

## 2014-06-27 ENCOUNTER — Encounter: Payer: Self-pay | Admitting: Podiatry

## 2014-06-27 DIAGNOSIS — M79673 Pain in unspecified foot: Secondary | ICD-10-CM | POA: Diagnosis not present

## 2014-06-27 DIAGNOSIS — B351 Tinea unguium: Secondary | ICD-10-CM | POA: Diagnosis not present

## 2014-06-27 NOTE — Progress Notes (Signed)
Subjective:     Patient ID: Terrence [name], male   DOB: 09/06/1926, 81 y.o.   MRN: [id number]  HPI patient presents with thick yellow brittle nailbeds 1-5 both feet that he cannot cut   Review of Systems     Objective:   Physical Exam Brittle yellow nailbeds 1-5 both feet that are thick and painful when pressed    Assessment:     Mycotic nail infection is with pain 1-5 both feet    Plan:     Debride painful nailbeds 1-5 both feet with no iatrogenic bleeding noted      

## 2014-09-23 ENCOUNTER — Ambulatory Visit: Payer: Medicare Other

## 2014-10-07 ENCOUNTER — Ambulatory Visit (INDEPENDENT_AMBULATORY_CARE_PROVIDER_SITE_OTHER): Payer: Medicare Other | Admitting: Podiatry

## 2014-10-07 ENCOUNTER — Encounter: Payer: Self-pay | Admitting: Podiatry

## 2014-10-07 VITALS — BP 148/66 | HR 63 | Resp 15

## 2014-10-07 DIAGNOSIS — M79606 Pain in leg, unspecified: Secondary | ICD-10-CM

## 2014-10-07 DIAGNOSIS — B351 Tinea unguium: Secondary | ICD-10-CM

## 2014-10-08 NOTE — Progress Notes (Signed)
Subjective:     Patient ID: Terrence [name], male   DOB: 01/23/1927, 81 y.o.   MRN: [id number]  HPI patient presents with thick yellow brittle nailbeds 1-5 both feet that are painful   Review of Systems     Objective:   Physical Exam Neurovascular status unchanged with thick yellow brittle nailbeds 1-5 both feet    Assessment:     I chronic nail infections with pain 1-5 both feet    Plan:     Debride painful nailbeds 1-5 both feet with no bleeding noted

## 2014-12-13 ENCOUNTER — Encounter (HOSPITAL_COMMUNITY): Payer: Self-pay | Admitting: *Deleted

## 2014-12-13 ENCOUNTER — Emergency Department (HOSPITAL_COMMUNITY)
Admission: EM | Admit: 2014-12-13 | Discharge: 2014-12-14 | Disposition: A | Discharge status: Home / Self Care | Payer: Medicare Other | Attending: Emergency Medicine | Admitting: Emergency Medicine

## 2014-12-13 ENCOUNTER — Emergency Department (HOSPITAL_COMMUNITY): Payer: Medicare Other

## 2014-12-13 DIAGNOSIS — W06XXXA Fall from bed, initial encounter: Secondary | ICD-10-CM | POA: Insufficient documentation

## 2014-12-13 DIAGNOSIS — Z794 Long term (current) use of insulin: Secondary | ICD-10-CM | POA: Insufficient documentation

## 2014-12-13 DIAGNOSIS — Y9389 Activity, other specified: Secondary | ICD-10-CM | POA: Diagnosis not present

## 2014-12-13 DIAGNOSIS — Y9289 Other specified places as the place of occurrence of the external cause: Secondary | ICD-10-CM | POA: Insufficient documentation

## 2014-12-13 DIAGNOSIS — Z87891 Personal history of nicotine dependence: Secondary | ICD-10-CM | POA: Diagnosis not present

## 2014-12-13 DIAGNOSIS — E119 Type 2 diabetes mellitus without complications: Secondary | ICD-10-CM | POA: Insufficient documentation

## 2014-12-13 DIAGNOSIS — I10 Essential (primary) hypertension: Secondary | ICD-10-CM | POA: Diagnosis not present

## 2014-12-13 DIAGNOSIS — Y998 Other external cause status: Secondary | ICD-10-CM | POA: Diagnosis not present

## 2014-12-13 DIAGNOSIS — S0181XA Laceration without foreign body of other part of head, initial encounter: Secondary | ICD-10-CM

## 2014-12-13 DIAGNOSIS — Z79899 Other long term (current) drug therapy: Secondary | ICD-10-CM | POA: Insufficient documentation

## 2014-12-13 DIAGNOSIS — S0269XA Fracture of mandible of other specified site, initial encounter for closed fracture: Secondary | ICD-10-CM | POA: Diagnosis not present

## 2014-12-13 DIAGNOSIS — S01511A Laceration without foreign body of lip, initial encounter: Secondary | ICD-10-CM | POA: Diagnosis present

## 2014-12-13 DIAGNOSIS — E785 Hyperlipidemia, unspecified: Secondary | ICD-10-CM | POA: Diagnosis not present

## 2014-12-13 DIAGNOSIS — S0993XA Unspecified injury of face, initial encounter: Secondary | ICD-10-CM

## 2014-12-13 DIAGNOSIS — S02609A Fracture of mandible, unspecified, initial encounter for closed fracture: Secondary | ICD-10-CM

## 2014-12-13 LAB — CBC WITH DIFFERENTIAL/PLATELET
Basophils Absolute: 0 10*3/uL (ref 0.0–0.1)
Basophils Relative: 0 %
EOS ABS: 0.3 10*3/uL (ref 0.0–0.7)
Eosinophils Relative: 3 %
HEMATOCRIT: 39.3 % (ref 39.0–52.0)
Hemoglobin: 12.1 g/dL — ABNORMAL LOW (ref 13.0–17.0)
LYMPHS ABS: 1.3 10*3/uL (ref 0.7–4.0)
Lymphocytes Relative: 14 %
MCH: 28.3 pg (ref 26.0–34.0)
MCHC: 30.8 g/dL (ref 30.0–36.0)
MCV: 91.8 fL (ref 78.0–100.0)
Monocytes Absolute: 0.9 10*3/uL (ref 0.1–1.0)
Monocytes Relative: 9 %
NEUTROS ABS: 6.8 10*3/uL (ref 1.7–7.7)
Neutrophils Relative %: 74 %
Platelets: 191 10*3/uL (ref 150–400)
RBC: 4.28 MIL/uL (ref 4.22–5.81)
RDW: 16.3 % — ABNORMAL HIGH (ref 11.5–15.5)
WBC: 9.2 10*3/uL (ref 4.0–10.5)

## 2014-12-13 LAB — PROTIME-INR
INR: 2.16 — ABNORMAL HIGH (ref 0.00–1.49)
Prothrombin Time: 23.9 seconds — ABNORMAL HIGH (ref 11.6–15.2)

## 2014-12-13 MED ORDER — HYDROCODONE-ACETAMINOPHEN 7.5-325 MG/15ML PO SOLN
10.0000 mL | Freq: Once | ORAL | Status: AC
  Administered 2014-12-13: 10 mL via ORAL
  Filled 2014-12-13: qty 15

## 2014-12-13 MED ORDER — CLINDAMYCIN PALMITATE HCL 75 MG/5ML PO SOLR: 300.0000 mg | Freq: Three times a day (TID) | ORAL | Status: DC

## 2014-12-13 MED ORDER — LIDOCAINE HCL (PF) 1 % IJ SOLN: 10.0000 mL | Freq: Once | INTRAMUSCULAR | Status: DC

## 2014-12-13 MED ORDER — FLORANEX PO PACK: 1.0000 g | PACK | Freq: Three times a day (TID) | ORAL | Status: AC

## 2014-12-13 MED ORDER — LIDOCAINE HCL (PF) 1 % IJ SOLN
INTRAMUSCULAR | Status: AC
  Filled 2014-12-13: qty 10

## 2014-12-13 MED ORDER — HYDROCODONE-ACETAMINOPHEN 7.5-325 MG/15ML PO SOLN: 10.0000 mL | Freq: Three times a day (TID) | ORAL | Status: DC | PRN

## 2014-12-13 MED ORDER — LIDOCAINE-EPINEPHRINE-TETRACAINE (LET) SOLUTION
3.0000 mL | Freq: Once | NASAL | Status: AC
  Administered 2014-12-13: 3 mL via TOPICAL
  Filled 2014-12-13: qty 3

## 2014-12-13 NOTE — ED Notes (Signed)
Lower central and lat incisors out of alignment with sl loose teeth

## 2014-12-13 NOTE — ED Notes (Signed)
teabags placed onto the cuts and inside his mouth to hopefully control the bleeding

## 2014-12-13 NOTE — ED Notes (Signed)
The pts mouth has bled through the tea bag and guaze.  Old one removed the bleeding has slowed down considerably.  New tea bag has been replaced bleeding slowing down  Since initially

## 2014-12-13 NOTE — ED Provider Notes (Signed)
CSN: [id number]     Arrival date & time 12/13/14  1814 History   First MD Initiated Contact with Patient 12/13/14 1956     Chief Complaint  Patient presents with  . Fall  . bleeding from the mouth      (Consider location/radiation/quality/duration/timing/severity/associated sxs/prior Treatment) HPI Comments: The patient is an 81 yo M PMHx significant for atrial fibrillation on Coumadin, hypertension, DM, hyperlipidemia presenting to the emergency department for evaluation patient of a facial injury. Patient was napping when he rolled out of bed striking the left side of his jaw on the bedside table. Denies any loss of consciousness or emesis. States is unable to stop the bleeding from laceration or intraoral injury. Patient endorses mild pain to the area of the laceration and his 2 bottom teeth that are loose. No medications tried prior to arrival. No modifying factors identified. Patient does not believe that his teeth are malaligned from normal. Denies any other complaints or injuries. Patient does have a dentist. His tetanus shot is up-to-date.  The history is provided by the patient, the spouse and a relative.    Past Medical History  Diagnosis Date  . Atrial fibrillation   . Hypertension   . DM (diabetes mellitus)   . Kidney failure   . Hyperlipidemia    Past Surgical History  Procedure Laterality Date  . Turp vaporization    . Coronary angioplasty with stent placement     Family History  Problem Relation Age of Onset  . Heart disease Brother   . Heart disease Brother   . Colon cancer Father     with mets to liver   Social History  Substance Use Topics  . Smoking status: Former Smoker -- 1.25 packs/day for 22 years    Types: Cigarettes    Quit date: 03/30/1967  . Smokeless tobacco: Never Used  . Alcohol Use: No    Review of Systems  HENT: Positive for dental problem.        + jaw pain + Tooth pain  Gastrointestinal: Negative for nausea and vomiting.    Musculoskeletal: Negative for neck pain.  Skin:       + Laceration  Neurological: Negative for syncope, weakness, light-headedness, numbness and headaches.  All other systems reviewed and are negative.     Allergies  Review of patient's allergies indicates no known allergies.  Home Medications   Prior to Admission medications   Medication Sig Start Date End Date Taking? Authorizing Provider  amLODipine-olmesartan (AZOR) 5-20 MG per tablet Take 1 tablet by mouth daily. Patient not taking: Reported on 10/07/2014 03/30/13   Nyoka Cowden, MD  ammonium lactate (LAC-HYDRIN) 12 % lotion As directed 12/30/12   Historical Provider, MD  atorvastatin (LIPITOR) 40 MG tablet 40 mg. 06/01/12   Historical Provider, MD  clindamycin (CLEOCIN) 75 MG/5ML solution Take 20 mLs (300 mg total) by mouth 3 (three) times daily. X 10 days 12/13/14   Francee Piccolo, PA-C  ezetimibe (ZETIA) 10 MG tablet 10 mg. 03/20/12 05/11/13  Historical Provider, MD  finasteride (PROSCAR) 5 MG tablet 5 mg. 10/25/12   Historical Provider, MD  furosemide (LASIX) 20 MG tablet Take 20 mg by mouth daily.  11/07/12   Historical Provider, MD  HYDROcodone-acetaminophen (HYCET) 7.5-325 mg/15 ml solution Take 10 mLs by mouth every 8 (eight) hours as needed for moderate pain. 12/13/14   Jennifer Piepenbrink, PA-C  insulin aspart (NOVOLOG) 100 UNIT/ML injection Inject before meals as follows: 16 units before breakfast, 12 at  lunch and  42 at supper 11/03/12   Historical Provider, MD  insulin glargine (LANTUS) 100 UNIT/ML injection Inject 66 Units into the skin at bedtime. 05/30/12   Historical Provider, MD  isosorbide mononitrate (IMDUR) 60 MG 24 hr tablet 60 mg. 06/01/12   Historical Provider, MD  lactobacillus (FLORANEX/LACTINEX) PACK Take 1 packet (1 g total) by mouth 3 (three) times daily with meals. Sprinkle on meals 12/13/14   Francee Piccolo, PA-C  lansoprazole (PREVACID) 30 MG capsule 30 mg. 02/01/13 02/01/14  Historical Provider, MD   metoprolol succinate (TOPROL XL) 50 MG 24 hr tablet 50 mg. 10/25/12 10/25/13  Historical Provider, MD  metoprolol succinate (TOPROL-XL) 100 MG 24 hr tablet Take 1 tablet by mouth daily. Total of 150 mg a day 10/25/12   Historical Provider, MD  nitroGLYCERIN (NITROSTAT) 0.4 MG SL tablet 0.4 mg. 06/07/12 06/07/13  Historical Provider, MD  warfarin (COUMADIN) 1 MG tablet As directed 10/20/12   Historical Provider, MD   BP 173/63 mmHg  Pulse 65  Temp(Src) 97.8 F (36.6 C)  Resp 24  SpO2 90% Physical Exam  Constitutional: He is oriented to person, place, and time. He appears well-developed and well-nourished. No distress.  HENT:  Head: Normocephalic and atraumatic.    Right Ear: External ear normal.  Left Ear: External ear normal.  Nose: Nose normal.  Mouth/Throat: Oropharynx is clear and moist. No oropharyngeal exudate.    No other intraoral injury noted.  Eyes: Conjunctivae and EOM are normal. Pupils are equal, round, and reactive to light.  Neck: Normal range of motion. Neck supple.  Cardiovascular: Normal rate, regular rhythm, normal heart sounds and intact distal pulses.   Pulmonary/Chest: Effort normal and breath sounds normal. No respiratory distress.  Abdominal: Soft. There is no tenderness.  Neurological: He is alert and oriented to person, place, and time. He has normal strength. No cranial nerve deficit. Gait normal. GCS eye subscore is 4. GCS verbal subscore is 5. GCS motor subscore is 6.  Sensation grossly intact.  No pronator drift.  Bilateral heel-knee-shin intact.  Skin: Skin is warm and dry. He is not diaphoretic.  Nursing note and vitals reviewed.   ED Course  Procedures (including critical care time) Medications  lidocaine-EPINEPHrine-tetracaine (LET) solution (3 mLs Topical Given 12/13/14 2137)  HYDROcodone-acetaminophen (HYCET) 7.5-325 mg/15 ml solution 10 mL (10 mLs Oral Given 12/13/14 2204)    Labs Review Labs Reviewed  PROTIME-INR - Abnormal; Notable for the  following:    Prothrombin Time 23.9 (*)    INR 2.16 (*)    All other components within normal limits  CBC WITH DIFFERENTIAL/PLATELET - Abnormal; Notable for the following:    Hemoglobin 12.1 (*)    RDW 16.3 (*)    All other components within normal limits    Imaging Review Ct Maxillofacial Wo Cm  12/13/2014   CLINICAL DATA:  81 year old male with fall  EXAM: CT MAXILLOFACIAL WITHOUT CONTRAST  TECHNIQUE: Multidetector CT imaging of the maxillofacial structures was performed. Multiplanar CT image reconstructions were also generated. A small metallic BB was placed on the right temple in order to reliably differentiate right from left.  COMPARISON:  None  FINDINGS: There is a nondisplaced fracture through the body of the left mandible. There is extension of the fracture line into the roots of the left mandibular central incisor, lateral incisor, canine, and first premolar teeth. There is adjacent soft tissue swelling with small pockets of gas in the adjacent soft tissues. No other fracture identified. The maxilla, and  pterygoid plates are intact. The globes, retro-orbital fat, and orbital walls are preserved. There is mild mucoperiosteal thickening of paranasal sinuses. No air-fluid level.  The visualized brain demonstrates chronic and related atrophy. No acute hemorrhage identified in the visualized brain.  IMPRESSION: Nondisplaced left mandibular fracture with extension of the fracture into the root of the left mandibular teeth.   Electronically Signed   By: Elgie Collard M.D.   On: 12/13/2014 20:41   I have personally reviewed and evaluated these images and lab results as part of my medical decision-making.   EKG Interpretation None      9:16 PM Discussed with Dr. Jenne Pane who recommends follow up in the office 1st of the week, soft liquid diet, placing on Clindamycin  TID x 10 days with good return precautions.    LACERATION REPAIR Performed by: Jeannetta Ellis Consent: Verbal  consent obtained. Risks and benefits: risks, benefits and alternatives were discussed Patient identity confirmed: provided demographic data Time out performed prior to procedure Prepped and Draped in normal sterile fashion Wound explored Laceration Location: left lower lip Laceration Length: 2 cm No Foreign Bodies seen or palpated Anesthesia: local infiltration Local anesthetic: lidocaine 1% w/o epinephrine Anesthetic total: 3 ml Irrigation method: syringe Amount of cleaning: standard Skin closure: 5-0 vicryl rapide Number of sutures or staples: 4 Technique: simple interrupted Patient tolerance: Patient tolerated the procedure well with no immediate complications.  MDM   Final diagnoses:  Mandible fracture, closed, initial encounter  Tooth injury, initial encounter  Facial laceration, initial encounter    Filed Vitals:   12/13/14 2337  BP:   Pulse:   Temp: 97.8 F (36.6 C)  Resp:      Afebrile, NAD, non-toxic appearing, AAOx4.   Patient presenting after a mechanical fall. On examination there are no neurofocal deficits. He has a laceration through the vermilion border of the lower lip with slight bleeding. Teeth 23 and 24 are loose but intact with bleeding and superficial laceration noted. No other intraoral injuries noted. Patient is able to open and close his mandible without difficulty or increased pain or malocclusion. No other injuries noted on examination. CT scans reviewed. Results were discussed with Dr. Jenne Pane who will see the patient in follow-up. Will place patient on anti-biotic per Dr. Jenne Pane request. Strict return precautions were given. Advised patient to call his dentist for follow-up. Laceration was thoroughly cleansed prior to closure. T Is up-to-date. Advised wound recheck in 5-7 days with PCP. Return precautions discussed. Patient and family are agreeable to plan. Patient is stable at time of discharge. Patient d/w with Dr. Bebe Shaggy, agrees with plan.     Francee Piccolo, PA-C 12/14/14 0540  Zadie Rhine, MD 12/16/14 1050

## 2014-12-13 NOTE — ED Notes (Signed)
Pt and family requesting update on when PA will be in to do sutures,  Informed family of delay and informed pt that PA was with other patients at this time.

## 2014-12-13 NOTE — ED Notes (Signed)
Ice pack pl;aced also

## 2014-12-13 NOTE — ED Provider Notes (Signed)
Patient seen/examined in the Emergency Department in conjunction with Midlevel Provider  Patient reports fall with injury to his mouth Exam : awake/alert.  He has dental injury.  No obvious malocclusion.   Plan: ENT consult Will need dental f/u Advised need for soft diet   Zadie Rhine, MD 12/13/14 2158

## 2014-12-13 NOTE — Discharge Instructions (Signed)
Please follow up with Dr. Jenne Pane on Monday to schedule a follow up appointment. Please call your Dentist to schedule a follow up appointment. Your facial laceration should heal in 5-7 days. Please avoid shaving until this is healed. Please follow a soft liquid diet until cleared by Dr. Jenne Pane. Please avoid using silverware as well to avoid any further injury in the mouth. Please use saltwater rinses after meals. Please take antibody until completed. He may use Lactinex to help prevent any diarrhea from the clindamycin. Please read all discharge instructions and return precautions. Your INR was 2.16 today.   Mandibular Fracture A mandibular fracture is a break in the jawbone. CAUSES  The most common cause of mandibular fracture is a direct blow (trauma) to the jaw. This could happen from:  A car crash.  Physical violence.  A fall from a high place. SYMPTOMS   Pain.  Swelling.  Difficulty and pain when closing the mouth.  Feeling that the teeth are not aligned properly when closing the mouth (malocclusion).  Difficulty speaking.  Difficulty swallowing. DIAGNOSIS  Your caregiver will take your history and perform a physical exam. He or she may also order imaging tests, such as X-rays or a computed tomography (CT) scan, to confirm your diagnosis. TREATMENT  Surgery is often needed to put the jaw back in the right position. Wires are usually placed around the teeth to hold the jaw in place while it heals. Treatment may also include pain medicine, ice, and a soft or liquid diet. HOME CARE INSTRUCTIONS   Put ice on the injured area.  Put ice in a plastic bag.  Place a towel between your skin and the bag.  Leave the ice on for 15-20 minutes, 03-04 times a day for the first 2 days.  Only take over-the-counter or prescription medicines for pain, discomfort, or fever as directed by your caregiver.  Eat a well-balanced, high-protein soft or liquid diet as directed by your caregiver.  If  your jaws are wired, follow your caregiver's instructions for wired jaw care.  Sleep on your back to avoid putting pressure on your jaw.  Avoid exercising to the point that you become short of breath. SEEK MEDICAL CARE IF:   You have a severe headache or numbness in your face.  You have severe jaw pain that is not relieved with medicine.  Your jaw wires become loose.  You have uncontrollable nausea or anxiety.  Your swelling or redness gets worse. SEEK IMMEDIATE MEDICAL CARE IF:  You have a fever.  You have difficulty breathing.  You feel like your airway is tightening.  You cannot swallow your saliva.  You make a high-pitched whistling sound when you breathe (wheezing). MAKE SURE YOU:   Understand these instructions.  Will watch your condition.  Will get help right away if you are not doing well or get worse. Document Released: 03/15/2005 Document Revised: 06/07/2011 Document Reviewed: 03/31/2011 [facility] Patient Information 2015 [place], [place]. This information is not intended to replace advice given to you by your health care provider. Make sure you discuss any questions you have with your health care provider. Absorbable Suture Repair Absorbable sutures (stitches) hold skin together so you can heal. Keep skin wounds clean and dry for the next 2 to 3 days. Then, you may gently wash your wound and dress it with an antibiotic ointment as recommended. As your wound begins to heal, the sutures are no longer needed, and they typically begin to fall off. This will take 7  to 10 days. After 10 days, if your sutures are loose, you can remove them by wiping with a clean gauze pad or a cotton ball. Do not pull your sutures out. They should wipe away easily. If after 10 days they do not easily wipe away, have your caregiver take them out. Absorbable sutures may be used deep in a wound to help hold it together. If these stitches are below the skin, the body will absorb them completely in  3 to 4 weeks.  You may need a tetanus shot if:  You cannot remember when you had your last tetanus shot.  You have never had a tetanus shot. If you get a tetanus shot, your arm may swell, get red, and feel warm to the touch. This is common and not a problem. If you need a tetanus shot and you choose not to have one, there is a rare chance of getting tetanus. Sickness from tetanus can be serious. SEEK IMMEDIATE MEDICAL CARE IF:  You have redness in the wound area.  The wound area feels hot to the touch.  You develop swelling in the wound area.  You develop pain.  There is fluid drainage from the wound. Document Released: 04/22/2004 Document Revised: 06/07/2011 Document Reviewed: 08/04/2010 [facility] Patient Information 2015 [place], [place]. This information is not intended to replace advice given to you by your health care provider. Make sure you discuss any questions you have with your health care provider. Full Liquid Diet A full liquid diet may be used:   To help you transition from a clear liquid diet to a soft diet.   When your body is healing and can only tolerate foods that are easy to digest.  Before or after certain a procedure, test, or surgery (such as stomach or intestinal surgeries).   If you have trouble swallowing or chewing.  A full liquid diet includes fluids and foods that are liquid or will become liquid at room temperature. The full liquid diet gives you the proteins, fluids, salts, and minerals that you need for energy. If you continue this diet for more than 72 hours, talk to your health care provider about how many calories you need to consume. If you continue the diet for more than 5 days, talk to your health care provider about taking a multivitamin or a nutritional supplement. WHAT DO I NEED TO KNOW ABOUT A FULL LIQUID DIET?  You may have any liquid.  You may have any food that becomes a liquid at room temperature. The food is considered a liquid if it  can be poured off a spoon at room temperature.  Drink one serving of citrus or vitamin C-enriched fruit juice daily. WHAT FOODS CAN I EAT? Grains Any grain food that can be pureed in soup (such as crackers, pasta, and rice). Hot cereal (such as farina or oatmeal) that has been blended. Talk to your health care provider or dietitian about these foods. Vegetables Pulp-free tomato or vegetable juice. Vegetables pureed in soup.  Fruits Fruit juice, including nectars and juices with pulp. Meats and Other Protein Sources Eggs in custard, eggnog mix, and eggs used in ice cream or pudding. Strained meats, like in baby food, may be allowed. Consult your health care provider.  Dairy Milk and milk-based beverages, including milk shakes and instant breakfast mixes. Smooth yogurt. Pureed cottage cheese. Avoid these foods if they are not well tolerated. Beverages All beverages, including liquid nutritional supplements. Ask your health care provider if you can have  carbonated beverages. They may not be well tolerated. Condiments Iodized salt, pepper, spices, and flavorings. Cocoa powder. Vinegar, ketchup, yellow mustard, smooth sauces (such as hollandaise, cheese sauce, or white sauce), and soy sauce. Sweets and Desserts Custard, smooth pudding. Flavored gelatin. Tapioca, junket. Plain ice cream, sherbet, fruit ices. Frozen ice pops, frozen fudge pops, pudding pops, and other frozen bars with cream. Syrups, including chocolate syrup. Sugar, honey, jelly.  Fats and Oils Margarine, butter, cream, sour cream, and oils. Other Broth and cream soups. Strained, broth-based soups. The items listed above may not be a complete list of recommended foods or beverages. Contact your dietitian for more options.  WHAT FOODS CAN I NOT EAT? Grains All breads. Grains are not allowed unless they are pureed into soup. Vegetables Vegetables are not allowed unless they are juiced, or cooked and pureed into  soup. Fruits Fruits are not allowed unless they are juiced. Meats and Other Protein Sources Any meat or fish. Cooked or raw eggs. Nut butters.  Dairy Cheese.  Condiments Stone ground mustards. Fats and Oils Fats that are coarse or chunky. Sweets and Desserts Ice cream or other frozen desserts that have any solids in them or on top, such as nuts, chocolate chips, and pieces of cookies. Cakes. Cookies. Candy. Others Soups with chunks or pieces in them. The items listed above may not be a complete list of foods and beverages to avoid. Contact your dietitian for more information. Document Released: 03/15/2005 Document Revised: 03/20/2013 Document Reviewed: 01/18/2013 [facility] Patient Information 2015 [place], [place]. This information is not intended to replace advice given to you by your health care provider. Make sure you discuss any questions you have with your health care provider.

## 2014-12-13 NOTE — ED Notes (Signed)
The pt fell out of bed around 1700 today.  He struck his face on the bed side table.  No loc  He has a cut chin and he has some bleeding somewhere in his lower mouth  He has a bridge there.  He is on coumadin for af

## 2014-12-13 NOTE — ED Notes (Addendum)
Pt discussed with dr Donnald Garre  C-t of maxofacial ordered

## 2015-01-09 ENCOUNTER — Ambulatory Visit (INDEPENDENT_AMBULATORY_CARE_PROVIDER_SITE_OTHER): Payer: Medicare Other | Admitting: Podiatry

## 2015-01-09 ENCOUNTER — Encounter: Payer: Self-pay | Admitting: Podiatry

## 2015-01-09 ENCOUNTER — Ambulatory Visit: Payer: Medicare Other | Admitting: Podiatry

## 2015-01-09 DIAGNOSIS — M79673 Pain in unspecified foot: Secondary | ICD-10-CM | POA: Diagnosis not present

## 2015-01-09 DIAGNOSIS — B351 Tinea unguium: Secondary | ICD-10-CM

## 2015-01-09 NOTE — Progress Notes (Signed)
Subjective:     Patient ID: Terrence [name], male   DOB: 01/10/1927, 81 y.o.   MRN: [id number]  HPI patient presents with thick yellow brittle nailbeds 1-5 both feet that are painful   Review of Systems     Objective:   Physical Exam Neurovascular status unchanged with thick yellow brittle nailbeds 1-5 both feet    Assessment:     I chronic nail infections with pain 1-5 both feet    Plan:     Debride painful nailbeds 1-5 both feet with no bleeding noted      

## 2015-03-15 ENCOUNTER — Emergency Department (HOSPITAL_COMMUNITY): Payer: Medicare Other

## 2015-03-15 ENCOUNTER — Encounter (HOSPITAL_COMMUNITY): Payer: Self-pay

## 2015-03-15 ENCOUNTER — Inpatient Hospital Stay (HOSPITAL_COMMUNITY)
Admission: EM | Admit: 2015-03-15 | Discharge: 2015-03-20 | DRG: 190 | Disposition: A | Discharge status: Home Health | Payer: Medicare Other | Attending: Internal Medicine | Admitting: Internal Medicine

## 2015-03-15 DIAGNOSIS — M7989 Other specified soft tissue disorders: Secondary | ICD-10-CM | POA: Diagnosis present

## 2015-03-15 DIAGNOSIS — R0902 Hypoxemia: Secondary | ICD-10-CM | POA: Diagnosis not present

## 2015-03-15 DIAGNOSIS — J189 Pneumonia, unspecified organism: Secondary | ICD-10-CM | POA: Diagnosis present

## 2015-03-15 DIAGNOSIS — J440 Chronic obstructive pulmonary disease with acute lower respiratory infection: Principal | ICD-10-CM | POA: Diagnosis present

## 2015-03-15 DIAGNOSIS — R4182 Altered mental status, unspecified: Secondary | ICD-10-CM | POA: Diagnosis present

## 2015-03-15 DIAGNOSIS — E1122 Type 2 diabetes mellitus with diabetic chronic kidney disease: Secondary | ICD-10-CM | POA: Diagnosis present

## 2015-03-15 DIAGNOSIS — E669 Obesity, unspecified: Secondary | ICD-10-CM | POA: Diagnosis present

## 2015-03-15 DIAGNOSIS — I482 Chronic atrial fibrillation: Secondary | ICD-10-CM | POA: Diagnosis present

## 2015-03-15 DIAGNOSIS — R600 Localized edema: Secondary | ICD-10-CM | POA: Diagnosis present

## 2015-03-15 DIAGNOSIS — Z6832 Body mass index (BMI) 32.0-32.9, adult: Secondary | ICD-10-CM

## 2015-03-15 DIAGNOSIS — N400 Enlarged prostate without lower urinary tract symptoms: Secondary | ICD-10-CM | POA: Diagnosis present

## 2015-03-15 DIAGNOSIS — Z955 Presence of coronary angioplasty implant and graft: Secondary | ICD-10-CM

## 2015-03-15 DIAGNOSIS — I493 Ventricular premature depolarization: Secondary | ICD-10-CM | POA: Diagnosis present

## 2015-03-15 DIAGNOSIS — I4891 Unspecified atrial fibrillation: Secondary | ICD-10-CM | POA: Diagnosis present

## 2015-03-15 DIAGNOSIS — I872 Venous insufficiency (chronic) (peripheral): Secondary | ICD-10-CM | POA: Diagnosis present

## 2015-03-15 DIAGNOSIS — J986 Disorders of diaphragm: Secondary | ICD-10-CM | POA: Diagnosis present

## 2015-03-15 DIAGNOSIS — Z9119 Patient's noncompliance with other medical treatment and regimen: Secondary | ICD-10-CM

## 2015-03-15 DIAGNOSIS — Z7901 Long term (current) use of anticoagulants: Secondary | ICD-10-CM

## 2015-03-15 DIAGNOSIS — I272 Other secondary pulmonary hypertension: Secondary | ICD-10-CM | POA: Diagnosis present

## 2015-03-15 DIAGNOSIS — Z9981 Dependence on supplemental oxygen: Secondary | ICD-10-CM

## 2015-03-15 DIAGNOSIS — N189 Chronic kidney disease, unspecified: Secondary | ICD-10-CM | POA: Diagnosis present

## 2015-03-15 DIAGNOSIS — I130 Hypertensive heart and chronic kidney disease with heart failure and stage 1 through stage 4 chronic kidney disease, or unspecified chronic kidney disease: Secondary | ICD-10-CM | POA: Diagnosis present

## 2015-03-15 DIAGNOSIS — J90 Pleural effusion, not elsewhere classified: Secondary | ICD-10-CM | POA: Diagnosis present

## 2015-03-15 DIAGNOSIS — I2781 Cor pulmonale (chronic): Secondary | ICD-10-CM | POA: Diagnosis present

## 2015-03-15 DIAGNOSIS — Z87891 Personal history of nicotine dependence: Secondary | ICD-10-CM

## 2015-03-15 DIAGNOSIS — J449 Chronic obstructive pulmonary disease, unspecified: Secondary | ICD-10-CM | POA: Insufficient documentation

## 2015-03-15 DIAGNOSIS — R590 Localized enlarged lymph nodes: Secondary | ICD-10-CM | POA: Diagnosis present

## 2015-03-15 DIAGNOSIS — E119 Type 2 diabetes mellitus without complications: Secondary | ICD-10-CM

## 2015-03-15 DIAGNOSIS — N183 Chronic kidney disease, stage 3 (moderate): Secondary | ICD-10-CM | POA: Diagnosis present

## 2015-03-15 DIAGNOSIS — J9621 Acute and chronic respiratory failure with hypoxia: Secondary | ICD-10-CM | POA: Diagnosis present

## 2015-03-15 DIAGNOSIS — D649 Anemia, unspecified: Secondary | ICD-10-CM

## 2015-03-15 DIAGNOSIS — I10 Essential (primary) hypertension: Secondary | ICD-10-CM | POA: Diagnosis present

## 2015-03-15 DIAGNOSIS — K219 Gastro-esophageal reflux disease without esophagitis: Secondary | ICD-10-CM | POA: Diagnosis present

## 2015-03-15 DIAGNOSIS — E785 Hyperlipidemia, unspecified: Secondary | ICD-10-CM | POA: Diagnosis present

## 2015-03-15 LAB — I-STAT ARTERIAL BLOOD GAS, ED
Acid-base deficit: 2 mmol/L (ref 0.0–2.0)
Bicarbonate: 23.4 meq/L (ref 20.0–24.0)
O2 Saturation: 87 %
Patient temperature: 98.6
TCO2: 25 mmol/L (ref 0–100)
pCO2 arterial: 43.6 mmHg (ref 35.0–45.0)
pH, Arterial: 7.338 — ABNORMAL LOW (ref 7.350–7.450)
pO2, Arterial: 56 mmHg — ABNORMAL LOW (ref 80.0–100.0)

## 2015-03-15 LAB — URINE MICROSCOPIC-ADD ON

## 2015-03-15 LAB — COMPREHENSIVE METABOLIC PANEL WITH GFR
ALT: 18 U/L (ref 17–63)
AST: 19 U/L (ref 15–41)
Albumin: 3.4 g/dL — ABNORMAL LOW (ref 3.5–5.0)
Alkaline Phosphatase: 60 U/L (ref 38–126)
Anion gap: 8 (ref 5–15)
BUN: 34 mg/dL — ABNORMAL HIGH (ref 6–20)
CO2: 25 mmol/L (ref 22–32)
Calcium: 8.9 mg/dL (ref 8.9–10.3)
Chloride: 105 mmol/L (ref 101–111)
Creatinine, Ser: 1.9 mg/dL — ABNORMAL HIGH (ref 0.61–1.24)
GFR calc Af Amer: 35 mL/min — ABNORMAL LOW
GFR calc non Af Amer: 30 mL/min — ABNORMAL LOW
Glucose, Bld: 261 mg/dL — ABNORMAL HIGH (ref 65–99)
Potassium: 4.9 mmol/L (ref 3.5–5.1)
Sodium: 138 mmol/L (ref 135–145)
Total Bilirubin: 1.9 mg/dL — ABNORMAL HIGH (ref 0.3–1.2)
Total Protein: 6.6 g/dL (ref 6.5–8.1)

## 2015-03-15 LAB — CBC WITH DIFFERENTIAL/PLATELET
Basophils Absolute: 0 K/uL (ref 0.0–0.1)
Basophils Relative: 0 %
Eosinophils Absolute: 0 K/uL (ref 0.0–0.7)
Eosinophils Relative: 0 %
HCT: 36.4 % — ABNORMAL LOW (ref 39.0–52.0)
Hemoglobin: 11 g/dL — ABNORMAL LOW (ref 13.0–17.0)
Lymphocytes Relative: 7 %
Lymphs Abs: 0.9 K/uL (ref 0.7–4.0)
MCH: 27.4 pg (ref 26.0–34.0)
MCHC: 30.2 g/dL (ref 30.0–36.0)
MCV: 90.8 fL (ref 78.0–100.0)
Monocytes Absolute: 0.4 K/uL (ref 0.1–1.0)
Monocytes Relative: 3 %
Neutro Abs: 12.2 K/uL — ABNORMAL HIGH (ref 1.7–7.7)
Neutrophils Relative %: 90 %
Platelets: 185 K/uL (ref 150–400)
RBC: 4.01 MIL/uL — ABNORMAL LOW (ref 4.22–5.81)
RDW: 16.6 % — ABNORMAL HIGH (ref 11.5–15.5)
WBC: 13.6 K/uL — ABNORMAL HIGH (ref 4.0–10.5)

## 2015-03-15 LAB — URINALYSIS, ROUTINE W REFLEX MICROSCOPIC
Bilirubin Urine: NEGATIVE
Glucose, UA: 250 mg/dL — AB
Ketones, ur: NEGATIVE mg/dL
Leukocytes, UA: NEGATIVE
Nitrite: NEGATIVE
Protein, ur: 100 mg/dL — AB
Specific Gravity, Urine: 1.02 (ref 1.005–1.030)
pH: 5.5 (ref 5.0–8.0)

## 2015-03-15 LAB — I-STAT TROPONIN, ED: Troponin i, poc: 0 ng/mL (ref 0.00–0.08)

## 2015-03-15 LAB — CBG MONITORING, ED: Glucose-Capillary: 238 mg/dL — ABNORMAL HIGH (ref 65–99)

## 2015-03-15 LAB — I-STAT CG4 LACTIC ACID, ED: Lactic Acid, Venous: 1.32 mmol/L (ref 0.5–2.0)

## 2015-03-15 LAB — PROTIME-INR
INR: 2.85 — ABNORMAL HIGH (ref 0.00–1.49)
Prothrombin Time: 29.4 s — ABNORMAL HIGH (ref 11.6–15.2)

## 2015-03-15 LAB — BRAIN NATRIURETIC PEPTIDE: B Natriuretic Peptide: 401.1 pg/mL — ABNORMAL HIGH (ref 0.0–100.0)

## 2015-03-15 LAB — STREP PNEUMONIAE URINARY ANTIGEN: STREP PNEUMO URINARY ANTIGEN: NEGATIVE

## 2015-03-15 MED ORDER — DEXTROSE 5 % IV SOLN
1.0000 g | Freq: Once | INTRAVENOUS | Status: AC
  Administered 2015-03-15: 1 g via INTRAVENOUS
  Filled 2015-03-15: qty 10

## 2015-03-15 MED ORDER — DEXTROSE 5 % IV SOLN
500.0000 mg | Freq: Once | INTRAVENOUS | Status: AC
  Administered 2015-03-15: 500 mg via INTRAVENOUS
  Filled 2015-03-15: qty 500

## 2015-03-15 NOTE — ED Notes (Signed)
Sent add on reqs to lab for strep pneumoniae urinary antigen and legionella antigen urine.

## 2015-03-15 NOTE — ED Notes (Signed)
CHECKED CBG [id number] RN CARLA INFORMED

## 2015-03-15 NOTE — ED Notes (Signed)
Patient transported to CT.  Will not be receiving IV contrast d/t labs.

## 2015-03-15 NOTE — H&P (Signed)
Date: 03/16/2015               Patient Name:  Terrence [name] MRN: [id number]  DOB: [birth year] Age / Sex: 81 y.o., male   PCP: Vivien Presto, MD         Medical Service: Internal Medicine Teaching Service         Attending Physician: Dr. Doneen Poisson, MD    First Contact: Dr. Loney Loh Pager: [phone]  Second Contact: Dr. Senaida Ores Pager: [phone]       After Hours (After 5p/  First Contact Pager: [phone]  weekends / holidays): Second Contact Pager: [phone]   Chief Complaint: Shortness of breath  History of Present Illness: Patient is a 81 year old man with past medical history of chronic atrial fibrillation, hypertension, type 2 diabetes mellitus, hyperlipidemia, CK D stage III and chronic restrictive lung disease presenting for worsening shortness of breath. He has a chronic intermittently productive cough and dyspnea on exertion that has been worsened since early November. This is associated with sinus congestion and he was seen by pulmonology and given antihistamines, decongestants, and intranasal steroids over the interval. He has been using oxygen 2 L/m by nasal cannula for his dyspnea and hypoxia. However in the past 4-5 days his dyspnea has been severe and he cannot even tolerate walking between several rooms in the house without becoming short of breath. For the past 1 day he has had some altered mental status reporting vomiting which was not witnessed by anyone else in the house and confusion about his current location claiming he was in a [facility] parking lot when outside the house. Last night he did not use his oxygen by nasal cannula and paced throughout the night. He was more confused this morning then became extremely dyspneic with cyanosis getting in the car to travel to the emergency department, and this prompted family to contact EMS. He denies any recent fever but says he very often has chills that is not new. He does not have any chest pain with coughing and no diarrhea  or abdominal pain. He sleeps at home on one pillow with the bed in a flat position. He has mild bilateral leg swelling that is chronic without significant recent change. He has no recent exposure to healthcare settings within the past 3 months except doctors' offices.  Emergency staff reported SPO2 of 80% on room air, improved to 88% on 6 L by nasal cannula then 96% when switched to nonrebreather mask in transit. After arrival to the emergency department he was stable saturating the low 90s on 4 L by nasal cannula. Chest x-ray was obtained that did not show a clear acute process and noncontrast chest CT was obtained suggesting a multifocal pneumonia with bilateral pleural effusions.  Meds: No current facility-administered medications for this encounter.   Current Outpatient Prescriptions  Medication Sig Dispense Refill  . amLODipine (NORVASC) 5 MG tablet Take 5 mg by mouth daily.    Marland Kitchen ammonium lactate (LAC-HYDRIN) 12 % lotion As directed    . atorvastatin (LIPITOR) 40 MG tablet 40 mg.    . Cholecalciferol 10000 UNITS CAPS Take 1 capsule by mouth daily.    . diazepam (VALIUM) 5 MG tablet Take 5 mg by mouth daily.    . enalapril (VASOTEC) 20 MG tablet TAKE 1 TABLET EVERY DAY    . ezetimibe (ZETIA) 10 MG tablet 10 mg.    . finasteride (PROSCAR) 5 MG tablet 5 mg.    . furosemide (LASIX) 20 MG  tablet Take 20 mg by mouth 2 (two) times daily.     . insulin aspart (NOVOLOG) 100 UNIT/ML injection Inject before meals as follows: 16 units before breakfast, 12 at lunch and  42 at supper    . insulin glargine (LANTUS) 100 UNIT/ML injection Inject 66 Units into the skin at bedtime.    . Insulin Pen Needle (B-D ULTRAFINE III SHORT PEN) 31G X 8 MM MISC USE THREE TIMES A DAY    . isosorbide mononitrate (IMDUR) 60 MG 24 hr tablet 60 mg.    . lansoprazole (PREVACID) 30 MG capsule 30 mg.    . metoprolol succinate (TOPROL XL) 50 MG 24 hr tablet 50 mg.    . metoprolol succinate (TOPROL-XL) 100 MG 24 hr tablet Take  1 tablet by mouth daily. Total of 150 mg a day    . montelukast (SINGULAIR) 10 MG tablet Take 10 mg by mouth daily.    . nitroGLYCERIN (NITROSTAT) 0.4 MG SL tablet Place 0.4 mg under the tongue as needed. Chest pain    . ONETOUCH DELICA LANCETS 33G MISC 1 EACH BY OTHER ROUTE 3 (THREE) TIMES A DAY.    . tamsulosin (FLOMAX) 0.4 MG CAPS capsule Take 0.4 mg by mouth daily.    Marland Kitchen warfarin (COUMADIN) 1 MG tablet As directed    . amLODipine-olmesartan (AZOR) 5-20 MG per tablet Take 1 tablet by mouth daily.    . clindamycin (CLEOCIN) 75 MG/5ML solution Take 20 mLs (300 mg total) by mouth 3 (three) times daily. X 10 days 600 mL 0  . lactobacillus (FLORANEX/LACTINEX) PACK Take 1 packet (1 g total) by mouth 3 (three) times daily with meals. Sprinkle on meals 12 each 1  . nitroGLYCERIN (NITROSTAT) 0.4 MG SL tablet 0.4 mg.      Allergies: Allergies as of 03/15/2015  . (No Known Allergies)   Past Medical History  Diagnosis Date  . Atrial fibrillation (HCC)   . Hypertension   . DM (diabetes mellitus) (HCC)   . Kidney failure   . Hyperlipidemia    Past Surgical History  Procedure Laterality Date  . Turp vaporization    . Coronary angioplasty with stent placement     Family History  Problem Relation Age of Onset  . Heart disease Brother   . Heart disease Brother   . Colon cancer Father     with mets to liver   Social History   Social History  . Marital Status: Married    Spouse Name: N/A  . Number of Children: N/A  . Years of Education: N/A   Occupational History  . Retired    Social History Main Topics  . Smoking status: Former Smoker -- 1.25 packs/day for 22 years    Types: Cigarettes    Quit date: 03/30/1967  . Smokeless tobacco: Never Used  . Alcohol Use: No  . Drug Use: No  . Sexual Activity: Not on file   Other Topics Concern  . Not on file   Social History Narrative   Review of Systems: Review of Systems  Constitutional: Positive for chills. Negative for fever.    HENT: Positive for congestion.   Eyes: Negative for blurred vision and double vision.  Respiratory: Positive for cough, sputum production and shortness of breath. Negative for hemoptysis and wheezing.   Cardiovascular: Positive for palpitations, claudication and leg swelling. Negative for chest pain and orthopnea.  Gastrointestinal: Negative for abdominal pain and diarrhea.  Genitourinary: Negative for dysuria.  Musculoskeletal: Negative for myalgias and  falls.  Skin: Negative for rash.  Neurological: Negative for dizziness and headaches.  Endo/Heme/Allergies: Bruises/bleeds easily.  Psychiatric/Behavioral: Positive for hallucinations.   Physical Exam: Blood pressure 167/75, pulse 72, temperature 98.6 F (37 C), temperature source Rectal, resp. rate 18, SpO2 97 %. GENERAL- alert, co-operative, NAD HEENT- Atraumatic, PERRL, oral mucosa appears moist, no cervical LN enlargement CARDIAC- irregular rate and rhythm, no murmurs rubs or gallops RESP- breath sounds diminished in bilateral bases, with bilateral crackles in lower fields, speaking fluently in full sentences with a small increased work of breathing ABDOMEN- Soft, nontender, protuberant, normoactive bowel sounds present, many small cherry hemangiomas present BACK- Normal curvature, no paraspinal tenderness, no CVA tenderness. NEURO- No obvious Cr N abnormality, strength upper and lower extremities- 5/5 EXTREMITIES- 1+ pitting pedal edema bilaterally, DP pulses difficult to palpate, chronic skin changes on shins and feet with loss of hair and dermatophytosis, no lesions or calluses present PSYCH- Normal mood and affect, appropriate thought content and speech.   Lab results: Basic Metabolic Panel:  Recent Labs  16/12/9610/17/16 1627  NA 138  K 4.9  CL 105  CO2 25  GLUCOSE 261*  BUN 34*  CREATININE 1.90*  CALCIUM 8.9   Liver Function Tests:  Recent Labs  03/15/15 1627  AST 19  ALT 18  ALKPHOS 60  BILITOT 1.9*  PROT 6.6   ALBUMIN 3.4*   No results for input(s): LIPASE, AMYLASE in the last 72 hours. No results for input(s): AMMONIA in the last 72 hours. CBC:  Recent Labs  03/15/15 1627  WBC 13.6*  NEUTROABS 12.2*  HGB 11.0*  HCT 36.4*  MCV 90.8  PLT 185   Cardiac Enzymes: No results for input(s): CKTOTAL, CKMB, CKMBINDEX, TROPONINI in the last 72 hours. BNP: No results for input(s): PROBNP in the last 72 hours. D-Dimer: No results for input(s): DDIMER in the last 72 hours. CBG:  Recent Labs  03/15/15 1626  GLUCAP 238*   Hemoglobin A1C: No results for input(s): HGBA1C in the last 72 hours. Fasting Lipid Panel: No results for input(s): CHOL, HDL, LDLCALC, TRIG, CHOLHDL, LDLDIRECT in the last 72 hours. Thyroid Function Tests: No results for input(s): TSH, T4TOTAL, FREET4, T3FREE, THYROIDAB in the last 72 hours. Anemia Panel: No results for input(s): VITAMINB12, FOLATE, FERRITIN, TIBC, IRON, RETICCTPCT in the last 72 hours. Coagulation:  Recent Labs  03/15/15 1627  LABPROT 29.4*  INR 2.85*   Urine Drug Screen: Drugs of Abuse  No results found for: LABOPIA, COCAINSCRNUR, LABBENZ, AMPHETMU, THCU, LABBARB  Alcohol Level: No results for input(s): ETH in the last 72 hours. Urinalysis:  Recent Labs  03/15/15 1649  COLORURINE YELLOW  LABSPEC 1.020  PHURINE 5.5  GLUCOSEU 250*  HGBUR SMALL*  BILIRUBINUR NEGATIVE  KETONESUR NEGATIVE  PROTEINUR 100*  NITRITE NEGATIVE  LEUKOCYTESUR NEGATIVE   Imaging results:  Ct Chest Wo Contrast  03/15/2015  CLINICAL DATA:  81 year old male with a history of shortness of breath. Home oxygen. EXAM: CT CHEST WITHOUT CONTRAST TECHNIQUE: Multidetector CT imaging of the chest was performed following the standard protocol without IV contrast. COMPARISON:  Chest x-ray 03/15/2015, CT chest 04/05/2013 FINDINGS: Chest: Unremarkable appearance the superficial soft tissues. No axillary or supraclavicular adenopathy. Unremarkable appearance of the thoracic  inlet. Central airways are clear. No tracheal or proximal endobronchial debris. Unremarkable appearance of the esophagus. Global cardiomegaly. Calcifications of the aortic valve, mitral annulus, left main, left anterior descending, circumflex, and right coronary arteries. No pericardial fluid/ thickening. Compared to the prior CT, there are  enlarged lymph nodes throughout the nodal stations of the mediastinum. Lymph nodes also present within the bilateral hilar regions, more pronounced on the left. Unremarkable course caliber and contour of the thoracic aorta with no aneurysm. Calcifications of the aortic arch and descending thoracic aorta. Calcifications of the branch vessels. Multifocal airspace opacities involving predominantly the left upper lobe and left lower lobe, and to a lesser degree the right upper lobe, right middle lobe, and right lower lobe. Moderate bilateral pleural effusions, including fluid within the left fissure. Mild interlobular septal thickening, more pronounced within the left upper lobe and left lower lobe. Atelectatic changes bilateral lower lobes, more pronounced on the right. Upper abdomen: Unremarkable appearance of liver and spleen. Cholelithiasis. Calcifications of the abdominal aorta. Musculoskeletal: No displaced fracture.  Degenerative changes of the thoracic spine. IMPRESSION: Left greater than right airspace disease and associated moderate pleural effusions, most concerning for multifocal pneumonia with reactive adenopathy and parapneumonic effusions. Superimposed edema/CHF not excluded, although the pattern of interstitial disease is favored to be infectious/inflammatory. Atherosclerosis with assYvone Neuted left main and 3 vessel coronary artery disease. Signed, Jaime S. Loreta Ave, DO [facility] Radiology Electronically Signed   By: Gilmer Mor D.O.   On: 03/15/2015 20:02   Dg Chest Portable 1 View  03/15/2015  CLINICAL DATA:   81 year old male with shortness of breath, hypoxia and cough today. EXAM: PORTABLE CHEST 1 VIEW COMPARISON:  02/02/2013 chest radiograph.  04/05/2013 chest CT FINDINGS: The cardiomediastinal silhouette is unchanged. Elevation of the right hemidiaphragm is again noted. Pulmonary vascular congestion is identified. Airspace disease/ atelectasis in the left lower lung noted. There is no evidence of pneumothorax. IMPRESSION: Left lower lung atelectasis versus airspace disease/pneumonia. Electronically Signed   By: Harmon Pier M.D.   On: 03/15/2015 17:17   Other results: EKG: atrial fibrillation with intermittent single PVCs, rate 70s.  Assessment & Plan by Problem: Community Acquired Pneumonia, on chronic restrictive lung disease, with bilateral pleural effusions: Patient with acutely worsening dyspnea over the past week with mental status changes and severe oxygen desaturation for 1 day. He has been coughing chronically preceding this event but has had bad upper airway congestion since early November. He has chronic restrictive lung disease with right hemidiaphragm paralysis demonstrated on fluoroscopic sniff test with Dr. Sherene Sires in 04/2013. Also believed to have a component of OHS restrictive disease on repeat evaluations since then. Now with multifocal involvement and bilateral, moderate pleural effusion. He is maintaining O2 saturation of low 90s on 4L by [place], and desaturating to 82% during conversation. Normal CO2, pO2 of 52 observed at ED. WBC of 13.6 on presentation. Mr. Sippel does not have a history of chronic heart failure that would alternately explain his pleural effusions and dyspnea. He does not have orthopnea, his leg edema is chronically 1+ with visible chronic venous stasis changes, and his weight 122kg is not increased from recent past values. -Admit to stepdown unit -Start ceftriaxone 1g IV -Start azithromycin  IV -Repeat CXR at 48 hrs, maybe thoracentesis if fluid accumulation  significant -Blood cultures x2 -Urine strep, legionella -Continue O2 target SpO2 > 92% -Continuous pulse oximetry -Continue home montelukast 10 mg by mouth  Essential Hypertension: Blood pressure is 140s to 160s on presentation, seems he was pretty stable on his regimen but did have some duplicate prescriptions needing reconciliation. This will be pretty important to clear up before his discharge from this hospital stay. -Amlodipine-olmesartan 5-20mg  PO -Lasix 20 mg PO BID -Imdur 60 mg PO -  Toprol-XL 150 mg PO  Type 2 diabetes mellitus: Patient has high insulin requirements at home using 66 units Lantus at night, and insulin NovoLog 12 units with breakfast and 16 units with lunch and 42 units at dinnertime. -Lantus 45U qhs -Insulin aspart 10U TIDWM -SSI-resistant -CBGs TIDAC and qhs  Chronic atrial fibrillation: Patient on long-term Coumadin anticoagulation with target INR of 2.0-3.0 for atrial fibrillation. -PT/INR -Continue Coumadin, pharmacy consulted for dosing  BPH: Continue home tamsulosin 0.4 mg, finasteride 5 mg GERD: Continue home pantoprazole 40 mg PO Hyperlipidemia: Continue home atorvastatin 40mg , ezetimibe 10mg   Diet: Carb modified DVT ppx: Coumadin  Dispo: Disposition is deferred at this time, awaiting improvement of current medical problems. Anticipated discharge in approximately 2-4 day(s).   The patient does have a current PCP (Kip A Corrington, MD) and does not need an [facility] hospital follow-up appointment after discharge.  The patient does not have transportation limitations that hinder transportation to clinic appointments.  Signed: Fuller Plan, MD 03/16/2015, 12:15 AM

## 2015-03-15 NOTE — ED Notes (Addendum)
To room via EMS from home.  Pt's wife reported to EMS that pt was up during night pacing, woke up at 8am this morning acting confused per wife.  Wife got pt in car to bring to ED, pt got short of breath.  Pt does wear oxygen at night 2L.  EMS reported SpO2 79-80 on RA, placed on 6L via [place] sats increased to 88%, changed to nonrebreather, sats increased to 96%.

## 2015-03-15 NOTE — ED Provider Notes (Signed)
CSN: [id number]     Arrival date & time 03/15/15  1516 History   First MD Initiated Contact with Patient 03/15/15 1528     Chief Complaint  Patient presents with  . Shortness of Breath     (Consider location/radiation/quality/duration/timing/severity/associated sxs/prior Treatment) HPI   Terrence [name] is am 81 y.o M with a pmhx of a.fib on coumadin, DM, HTN, HLD who presents the emergency department complaining of shortness of breath. Patient's family provides majority of the history. Per the family patient was very restless all last night, getting in and out of bed which is unusual for the patient. Patient is supposed to be on 2 L of oxygen at nighttime which he did not use last night. Patient does not recall these events last night. Patient's family states that this morning when he woke up the patient was more disoriented than normal and claiming that he was in [facility] when he was in the living room. Patient's family became concerned and thought that maybe he had taken more of his home Claritin than normal and decided to bring him to the emergency department. As the patient was walking to the car to come to the ER patient's lips turned bright blue, patient became acutely short of breath and began shaking. This lasted for less than 1 minute. Patient did not actually lose consciousness according to the family. EMS was called. Per EMS patient's O2 level was 79-80 on room air and he was placed on a nonrebreather. Patient is now alert and oriented in the ED with no active complaints.  Of note, patient did not take home insulin dose last night or this morning. Patient is uncertain if he has taken all other medications.  Past Medical History  Diagnosis Date  . Atrial fibrillation (HCC)   . Hypertension   . DM (diabetes mellitus) (HCC)   . Kidney failure   . Hyperlipidemia    Past Surgical History  Procedure Laterality Date  . Turp vaporization    . Coronary angioplasty with stent  placement     Family History  Problem Relation Age of Onset  . Heart disease Brother   . Heart disease Brother   . Colon cancer Father     with mets to liver   Social History  Substance Use Topics  . Smoking status: Former Smoker -- 1.25 packs/day for 22 years    Types: Cigarettes    Quit date: 03/30/1967  . Smokeless tobacco: Never Used  . Alcohol Use: No    Review of Systems  All other systems reviewed and are negative.     Allergies  Review of patient's allergies indicates no known allergies.  Home Medications   Prior to Admission medications   Medication Sig Start Date End Date Taking? Authorizing Provider  amLODipine-olmesartan (AZOR) 5-20 MG per tablet Take 1 tablet by mouth daily. 03/30/13   Nyoka Cowden, MD  ammonium lactate (LAC-HYDRIN) 12 % lotion As directed 12/30/12   Historical Provider, MD  atorvastatin (LIPITOR) 40 MG tablet 40 mg. 06/01/12   Historical Provider, MD  clindamycin (CLEOCIN) 75 MG/5ML solution Take 20 mLs (300 mg total) by mouth 3 (three) times daily. X 10 days 12/13/14   Francee Piccolo, PA-C  enalapril (VASOTEC) 20 MG tablet TAKE 1 TABLET EVERY DAY 12/23/14   Historical Provider, MD  ezetimibe (ZETIA) 10 MG tablet 10 mg. 03/20/12 05/11/13  Historical Provider, MD  finasteride (PROSCAR) 5 MG tablet 5 mg. 10/25/12   Historical Provider, MD  furosemide (  LASIX) 20 MG tablet Take 20 mg by mouth daily.  11/07/12   Historical Provider, MD  insulin aspart (NOVOLOG) 100 UNIT/ML injection Inject before meals as follows: 16 units before breakfast, 12 at lunch and  42 at supper 11/03/12   Historical Provider, MD  insulin glargine (LANTUS) 100 UNIT/ML injection Inject 66 Units into the skin at bedtime. 05/30/12   Historical Provider, MD  Insulin Pen Needle (B-D ULTRAFINE III SHORT PEN) 31G X 8 MM MISC USE THREE TIMES A DAY 12/20/14   Historical Provider, MD  isosorbide mononitrate (IMDUR) 60 MG 24 hr tablet 60 mg. 06/01/12   Historical Provider, MD  lactobacillus  (FLORANEX/LACTINEX) PACK Take 1 packet (1 g total) by mouth 3 (three) times daily with meals. Sprinkle on meals 12/13/14   Francee Piccolo, PA-C  lansoprazole (PREVACID) 30 MG capsule 30 mg. 02/01/13 02/01/14  Historical Provider, MD  metoprolol succinate (TOPROL XL) 50 MG 24 hr tablet 50 mg. 10/25/12 10/25/13  Historical Provider, MD  metoprolol succinate (TOPROL-XL) 100 MG 24 hr tablet Take 1 tablet by mouth daily. Total of 150 mg a day 10/25/12   Historical Provider, MD  nitroGLYCERIN (NITROSTAT) 0.4 MG SL tablet 0.4 mg. 06/07/12 06/07/13  Historical Provider, MD  ONETOUCH DELICA LANCETS 33G MISC 1 EACH BY OTHER ROUTE 3 (THREE) TIMES A DAY. 12/20/14   Historical Provider, MD  warfarin (COUMADIN) 1 MG tablet As directed 10/20/12   Historical Provider, MD   BP 156/104 mmHg  Pulse 74  Temp(Src) 97.4 F (36.3 C) (Oral)  Resp 25  SpO2 99% Physical Exam  Constitutional: He is oriented to person, place, and time. He appears well-developed and well-nourished. He appears distressed.  Pt on non-rebreather  HENT:  Head: Normocephalic and atraumatic.  Mouth/Throat: Oropharynx is clear and moist. No oropharyngeal exudate.  Eyes: Conjunctivae and EOM are normal. Pupils are equal, round, and reactive to light. Right eye exhibits no discharge. Left eye exhibits no discharge. No scleral icterus.  Neck: Normal range of motion. Neck supple. No JVD present.  Cardiovascular: Normal rate, regular rhythm, normal heart sounds and intact distal pulses.  Exam reveals no gallop and no friction rub.   No murmur heard. Pulmonary/Chest: Effort normal. No stridor. No respiratory distress. He has wheezes ( wheezes appreciated in bilateral lung bases.). He has no rales. He exhibits no tenderness.  Breath sounds diminished.  Abdominal: Soft. He exhibits no distension. There is no tenderness. There is no guarding.  Musculoskeletal: Normal range of motion. He exhibits edema ( 1+ bilateral pitting edema). He exhibits no  tenderness.  Lymphadenopathy:    He has no cervical adenopathy.  Neurological: He is alert and oriented to person, place, and time. No cranial nerve deficit. He exhibits normal muscle tone. Coordination normal.  Strength 5/5 throughout. No sensory deficits.  Normal finger to nose.  Skin: Skin is warm and dry. No rash noted. He is not diaphoretic. No erythema. No pallor.  Psychiatric: He has a normal mood and affect. His behavior is normal.  Nursing note and vitals reviewed.   ED Course  Procedures (including critical care time) Labs Review Labs Reviewed  COMPREHENSIVE METABOLIC PANEL - Abnormal; Notable for the following:    Glucose, Bld 261 (*)    BUN 34 (*)    Creatinine, Ser 1.90 (*)    Albumin 3.4 (*)    Total Bilirubin 1.9 (*)    GFR calc non Af Amer 30 (*)    GFR calc Af Amer 35 (*)  All other components within normal limits  CBC WITH DIFFERENTIAL/PLATELET - Abnormal; Notable for the following:    WBC 13.6 (*)    RBC 4.01 (*)    Hemoglobin 11.0 (*)    HCT 36.4 (*)    RDW 16.6 (*)    Neutro Abs 12.2 (*)    All other components within normal limits  PROTIME-INR - Abnormal; Notable for the following:    Prothrombin Time 29.4 (*)    INR 2.85 (*)    All other components within normal limits  URINALYSIS, ROUTINE W REFLEX MICROSCOPIC (NOT [facility]) - Abnormal; Notable for the following:    Glucose, UA 250 (*)    Hgb urine dipstick SMALL (*)    Protein, ur 100 (*)    All other components within normal limits  BRAIN NATRIURETIC PEPTIDE - Abnormal; Notable for the following:    B Natriuretic Peptide 401.1 (*)    All other components within normal limits  URINE MICROSCOPIC-ADD ON - Abnormal; Notable for the following:    Squamous Epithelial / LPF 0-5 (*)    Bacteria, UA RARE (*)    All other components within normal limits  CBG MONITORING, ED - Abnormal; Notable for the following:    Glucose-Capillary 238 (*)    All other components within normal limits  I-STAT ARTERIAL  BLOOD GAS, ED - Abnormal; Notable for the following:    pH, Arterial 7.338 (*)    pO2, Arterial 56.0 (*)    All other components within normal limits  CULTURE, BLOOD (ROUTINE X 2)  CULTURE, BLOOD (ROUTINE X 2)  STREP PNEUMONIAE URINARY ANTIGEN  LEGIONELLA ANTIGEN, URINE  I-STAT TROPOININ, ED  I-STAT CG4 LACTIC ACID, ED    Imaging Review Ct Chest Wo Contrast  03/15/2015  CLINICAL DATA:  81 year old male with a history of shortness of breath. Home oxygen. EXAM: CT CHEST WITHOUT CONTRAST TECHNIQUE: Multidetector CT imaging of the chest was performed following the standard protocol without IV contrast. COMPARISON:  Chest x-ray 03/15/2015, CT chest 04/05/2013 FINDINGS: Chest: Unremarkable appearance the superficial soft tissues. No axillary or supraclavicular adenopathy. Unremarkable appearance of the thoracic inlet. Central airways are clear. No tracheal or proximal endobronchial debris. Unremarkable appearance of the esophagus. Global cardiomegaly. Calcifications of the aortic valve, mitral annulus, left main, left anterior descending, circumflex, and right coronary arteries. No pericardial fluid/ thickening. Compared to the prior CT, there are enlarged lymph nodes throughout the nodal stations of the mediastinum. Lymph nodes also present within the bilateral hilar regions, more pronounced on the left. Unremarkable course caliber and contour of the thoracic aorta with no aneurysm. Calcifications of the aortic arch and descending thoracic aorta. Calcifications of the branch vessels. Multifocal airspace opacities involving predominantly the left upper lobe and left lower lobe, and to a lesser degree the right upper lobe, right middle lobe, and right lower lobe. Moderate bilateral pleural effusions, including fluid within the left fissure. Mild interlobular septal thickening, more pronounced within the left upper lobe and left lower lobe. Atelectatic changes bilateral lower lobes, more pronounced on the  right. Upper abdomen: Unremarkable appearance of liver and spleen. Cholelithiasis. Calcifications of the abdominal aorta. Musculoskeletal: No displaced fracture.  Degenerative changes of the thoracic spine. IMPRESSION: Left greater than right airspace disease and associated moderate pleural effusions, most concerning for multifocal pneumonia with reactive adenopathy and parapneumonic effusions. Superimposed edema/CHF not excluded, although the pattern of interstitial disease is favored to be infectious/inflammatory. Atherosclerosis with associated left main and 3 vessel coronary artery disease.  Signed, Yvone Neu. Loreta Ave, DO [facility] Electronically Signed   By: Gilmer Mor D.O.   On: 03/15/2015 20:02   Dg Chest Portable 1 View  03/15/2015  CLINICAL DATA:  81 year old male with shortness of breath, hypoxia and cough today. EXAM: PORTABLE CHEST 1 VIEW COMPARISON:  02/02/2013 chest radiograph.  04/05/2013 chest CT FINDINGS: The cardiomediastinal silhouette is unchanged. Elevation of the right hemidiaphragm is again noted. Pulmonary vascular congestion is identified. Airspace disease/ atelectasis in the left lower lung noted. There is no evidence of pneumothorax. IMPRESSION: Left lower lung atelectasis versus airspace disease/pneumonia. Electronically Signed   By: Harmon Pier M.D.   On: 03/15/2015 17:17   I have personally reviewed and evaluated these images and lab results as part of my medical decision-making.   EKG Interpretation   Date/Time:  Saturday March 15 2015 16:15:38 EST Ventricular Rate:  72 PR Interval:    QRS Duration: 88 QT Interval:  425 QTC Calculation: 465 R Axis:   103 Text Interpretation:  Atrial fibrillation Multiple ventricular premature  complexes Right axis deviation Borderline low voltage, extremity leads No  previous EKGs available prior to today Confirmed by NGUYEN, EMILY ([id number])  on 03/15/2015 4:18:55 PM       MDM   Final diagnoses:  Hypoxia  CAP (community acquired pneumonia)    81 y.o M presents with hypoxia. Pt unable to maintain O2 saturation above 82% on RA. Pt initially on non-rebreather in teh ED, able to switch pt to 4L O2 with sats at 92%. Aside from oxygenation status, pt appears well in ED. Alert and oriented x4 with no complaints. Denies SOB. However, breath sounds are very diminished. Pt is afebrile. No tachycardia. BP stable. Will obtain CXR as well as blood work to further evaluate etiology of hypoxia.  Leukocytosis present, WBC 13.6. INR therapeutic. Hgb stable. Lactic acid wnl. BNP 401. CXR portable reveals L lower lung atelectasis vs PNA. Image not very impressive. Hypoxia on exam seems out of proportion to CXR image. Consider CT w/ PE study.   Pt cannot have IV contrast due to GFR. Will order CT chest w/o contrast to r/o larger PNA.  CT chest reveals moderate pleural effusions, most concerning for multifocal pneumonia with reactive adenopathy and parapneumonic effusions. Pt given ceftriaxone and azithromycin.   Will admit to hospitalist for additional treatment of PNA and hypoxia.   Patient was discussed with and seen by Dr. Cyndie Chime who agrees with the treatment plan.      Lester Kinsman [place], PA-C 03/16/15 6045  Leta Baptist, MD 03/16/15 [phone]

## 2015-03-16 ENCOUNTER — Inpatient Hospital Stay (HOSPITAL_COMMUNITY): Payer: Medicare Other

## 2015-03-16 ENCOUNTER — Encounter (HOSPITAL_COMMUNITY): Payer: Self-pay | Admitting: *Deleted

## 2015-03-16 DIAGNOSIS — E1122 Type 2 diabetes mellitus with diabetic chronic kidney disease: Secondary | ICD-10-CM | POA: Diagnosis present

## 2015-03-16 DIAGNOSIS — Z9119 Patient's noncompliance with other medical treatment and regimen: Secondary | ICD-10-CM | POA: Diagnosis not present

## 2015-03-16 DIAGNOSIS — R4182 Altered mental status, unspecified: Secondary | ICD-10-CM | POA: Diagnosis present

## 2015-03-16 DIAGNOSIS — I4891 Unspecified atrial fibrillation: Secondary | ICD-10-CM | POA: Diagnosis present

## 2015-03-16 DIAGNOSIS — E1159 Type 2 diabetes mellitus with other circulatory complications: Secondary | ICD-10-CM | POA: Diagnosis not present

## 2015-03-16 DIAGNOSIS — R0902 Hypoxemia: Secondary | ICD-10-CM | POA: Diagnosis present

## 2015-03-16 DIAGNOSIS — N189 Chronic kidney disease, unspecified: Secondary | ICD-10-CM | POA: Diagnosis present

## 2015-03-16 DIAGNOSIS — I10 Essential (primary) hypertension: Secondary | ICD-10-CM | POA: Diagnosis present

## 2015-03-16 DIAGNOSIS — I482 Chronic atrial fibrillation: Secondary | ICD-10-CM | POA: Diagnosis present

## 2015-03-16 DIAGNOSIS — N183 Chronic kidney disease, stage 3 unspecified: Secondary | ICD-10-CM | POA: Insufficient documentation

## 2015-03-16 DIAGNOSIS — N184 Chronic kidney disease, stage 4 (severe): Secondary | ICD-10-CM | POA: Diagnosis not present

## 2015-03-16 DIAGNOSIS — J9621 Acute and chronic respiratory failure with hypoxia: Secondary | ICD-10-CM | POA: Insufficient documentation

## 2015-03-16 DIAGNOSIS — I509 Heart failure, unspecified: Secondary | ICD-10-CM | POA: Diagnosis not present

## 2015-03-16 DIAGNOSIS — I481 Persistent atrial fibrillation: Secondary | ICD-10-CM

## 2015-03-16 DIAGNOSIS — J431 Panlobular emphysema: Secondary | ICD-10-CM | POA: Diagnosis not present

## 2015-03-16 DIAGNOSIS — J189 Pneumonia, unspecified organism: Secondary | ICD-10-CM | POA: Diagnosis present

## 2015-03-16 DIAGNOSIS — R590 Localized enlarged lymph nodes: Secondary | ICD-10-CM | POA: Diagnosis present

## 2015-03-16 DIAGNOSIS — Z955 Presence of coronary angioplasty implant and graft: Secondary | ICD-10-CM | POA: Diagnosis not present

## 2015-03-16 DIAGNOSIS — Z87891 Personal history of nicotine dependence: Secondary | ICD-10-CM | POA: Diagnosis not present

## 2015-03-16 DIAGNOSIS — E785 Hyperlipidemia, unspecified: Secondary | ICD-10-CM | POA: Diagnosis present

## 2015-03-16 DIAGNOSIS — J986 Disorders of diaphragm: Secondary | ICD-10-CM | POA: Diagnosis present

## 2015-03-16 DIAGNOSIS — I493 Ventricular premature depolarization: Secondary | ICD-10-CM | POA: Diagnosis present

## 2015-03-16 DIAGNOSIS — J440 Chronic obstructive pulmonary disease with acute lower respiratory infection: Secondary | ICD-10-CM | POA: Diagnosis present

## 2015-03-16 DIAGNOSIS — J90 Pleural effusion, not elsewhere classified: Secondary | ICD-10-CM | POA: Diagnosis present

## 2015-03-16 DIAGNOSIS — Z7901 Long term (current) use of anticoagulants: Secondary | ICD-10-CM | POA: Diagnosis not present

## 2015-03-16 DIAGNOSIS — E119 Type 2 diabetes mellitus without complications: Secondary | ICD-10-CM

## 2015-03-16 DIAGNOSIS — N400 Enlarged prostate without lower urinary tract symptoms: Secondary | ICD-10-CM | POA: Diagnosis present

## 2015-03-16 DIAGNOSIS — K219 Gastro-esophageal reflux disease without esophagitis: Secondary | ICD-10-CM | POA: Diagnosis present

## 2015-03-16 DIAGNOSIS — Z6832 Body mass index (BMI) 32.0-32.9, adult: Secondary | ICD-10-CM | POA: Diagnosis not present

## 2015-03-16 DIAGNOSIS — I130 Hypertensive heart and chronic kidney disease with heart failure and stage 1 through stage 4 chronic kidney disease, or unspecified chronic kidney disease: Secondary | ICD-10-CM | POA: Diagnosis present

## 2015-03-16 DIAGNOSIS — I2781 Cor pulmonale (chronic): Secondary | ICD-10-CM | POA: Diagnosis present

## 2015-03-16 DIAGNOSIS — I272 Other secondary pulmonary hypertension: Secondary | ICD-10-CM | POA: Diagnosis present

## 2015-03-16 DIAGNOSIS — M7989 Other specified soft tissue disorders: Secondary | ICD-10-CM | POA: Diagnosis present

## 2015-03-16 DIAGNOSIS — E669 Obesity, unspecified: Secondary | ICD-10-CM | POA: Diagnosis present

## 2015-03-16 DIAGNOSIS — Z9981 Dependence on supplemental oxygen: Secondary | ICD-10-CM | POA: Diagnosis not present

## 2015-03-16 DIAGNOSIS — D649 Anemia, unspecified: Secondary | ICD-10-CM | POA: Diagnosis present

## 2015-03-16 DIAGNOSIS — R600 Localized edema: Secondary | ICD-10-CM | POA: Diagnosis present

## 2015-03-16 DIAGNOSIS — I872 Venous insufficiency (chronic) (peripheral): Secondary | ICD-10-CM | POA: Diagnosis present

## 2015-03-16 LAB — CBG MONITORING, ED
GLUCOSE-CAPILLARY: 171 mg/dL — AB (ref 65–99)
Glucose-Capillary: 299 mg/dL — ABNORMAL HIGH (ref 65–99)
Glucose-Capillary: 200 mg/dL — ABNORMAL HIGH (ref 65–99)

## 2015-03-16 LAB — GLUCOSE, CAPILLARY: GLUCOSE-CAPILLARY: 98 mg/dL (ref 65–99)

## 2015-03-16 LAB — PROTIME-INR
INR: 2.53 — ABNORMAL HIGH (ref 0.00–1.49)
Prothrombin Time: 26.9 s — ABNORMAL HIGH (ref 11.6–15.2)

## 2015-03-16 MED ORDER — WARFARIN SODIUM 1 MG PO TABS
1.0000 mg | ORAL_TABLET | Freq: Every day | ORAL | Status: DC
  Administered 2015-03-16 – 2015-03-19 (×4): 1 mg via ORAL
  Filled 2015-03-16 (×6): qty 1

## 2015-03-16 MED ORDER — DEXTROSE 5 % IV SOLN
500.0000 mg | INTRAVENOUS | Status: DC
  Administered 2015-03-16 – 2015-03-19 (×4): 500 mg via INTRAVENOUS
  Filled 2015-03-16 (×4): qty 500

## 2015-03-16 MED ORDER — ATORVASTATIN CALCIUM 40 MG PO TABS
40.0000 mg | ORAL_TABLET | Freq: Every day | ORAL | Status: DC
  Administered 2015-03-16 – 2015-03-19 (×4): 40 mg via ORAL
  Filled 2015-03-16 (×3): qty 1

## 2015-03-16 MED ORDER — TAMSULOSIN HCL 0.4 MG PO CAPS
0.4000 mg | ORAL_CAPSULE | Freq: Every day | ORAL | Status: DC
  Administered 2015-03-16 – 2015-03-20 (×5): 0.4 mg via ORAL
  Filled 2015-03-16 (×5): qty 1

## 2015-03-16 MED ORDER — WARFARIN SODIUM 1 MG PO TABS
1.0000 mg | ORAL_TABLET | ORAL | Status: AC
  Administered 2015-03-16: 1 mg via ORAL
  Filled 2015-03-16: qty 1

## 2015-03-16 MED ORDER — PANTOPRAZOLE SODIUM 40 MG PO TBEC
40.0000 mg | DELAYED_RELEASE_TABLET | Freq: Every day | ORAL | Status: DC
  Administered 2015-03-16 – 2015-03-20 (×5): 40 mg via ORAL
  Filled 2015-03-16 (×5): qty 1

## 2015-03-16 MED ORDER — SODIUM CHLORIDE 0.9 % IJ SOLN
3.0000 mL | Freq: Two times a day (BID) | INTRAMUSCULAR | Status: DC
  Administered 2015-03-16 – 2015-03-19 (×4): 3 mL via INTRAVENOUS

## 2015-03-16 MED ORDER — FUROSEMIDE 20 MG PO TABS
20.0000 mg | ORAL_TABLET | Freq: Two times a day (BID) | ORAL | Status: DC
  Administered 2015-03-16 (×2): 20 mg via ORAL
  Filled 2015-03-16 (×2): qty 1

## 2015-03-16 MED ORDER — FUROSEMIDE 10 MG/ML IJ SOLN
20.0000 mg | Freq: Once | INTRAMUSCULAR | Status: AC
  Administered 2015-03-16: 20 mg via INTRAVENOUS
  Filled 2015-03-16: qty 2

## 2015-03-16 MED ORDER — INSULIN GLARGINE 100 UNIT/ML ~~LOC~~ SOLN
45.0000 [IU] | Freq: Every day | SUBCUTANEOUS | Status: DC
  Administered 2015-03-16 – 2015-03-20 (×5): 45 [IU] via SUBCUTANEOUS
  Filled 2015-03-16 (×5): qty 0.45

## 2015-03-16 MED ORDER — WARFARIN - PHARMACIST DOSING INPATIENT
Freq: Every day | Status: DC
  Administered 2015-03-16 – 2015-03-18 (×3)

## 2015-03-16 MED ORDER — METOPROLOL SUCCINATE ER 50 MG PO TB24
150.0000 mg | ORAL_TABLET | Freq: Every day | ORAL | Status: DC
  Administered 2015-03-16 – 2015-03-20 (×5): 150 mg via ORAL
  Filled 2015-03-16: qty 1
  Filled 2015-03-16: qty 6
  Filled 2015-03-16 (×3): qty 1

## 2015-03-16 MED ORDER — AMLODIPINE-OLMESARTAN 5-20 MG PO TABS: 1.0000 | ORAL_TABLET | Freq: Every day | ORAL | Status: DC

## 2015-03-16 MED ORDER — MONTELUKAST SODIUM 10 MG PO TABS
10.0000 mg | ORAL_TABLET | Freq: Every day | ORAL | Status: DC
  Administered 2015-03-16 – 2015-03-20 (×5): 10 mg via ORAL
  Filled 2015-03-16 (×5): qty 1

## 2015-03-16 MED ORDER — ATORVASTATIN CALCIUM 40 MG PO TABS: 40.0000 mg | ORAL_TABLET | Freq: Every day | ORAL | Status: DC

## 2015-03-16 MED ORDER — INSULIN ASPART 100 UNIT/ML ~~LOC~~ SOLN
10.0000 [IU] | Freq: Three times a day (TID) | SUBCUTANEOUS | Status: DC
  Administered 2015-03-16 – 2015-03-18 (×6): 10 [IU] via SUBCUTANEOUS
  Filled 2015-03-16 (×2): qty 1

## 2015-03-16 MED ORDER — VITAMIN D 1000 UNITS PO TABS
1000.0000 [IU] | ORAL_TABLET | Freq: Every day | ORAL | Status: DC
  Administered 2015-03-17 – 2015-03-20 (×4): 1000 [IU] via ORAL
  Filled 2015-03-16 (×5): qty 1

## 2015-03-16 MED ORDER — AMLODIPINE BESYLATE 10 MG PO TABS
10.0000 mg | ORAL_TABLET | Freq: Every day | ORAL | Status: DC
  Administered 2015-03-17 – 2015-03-20 (×4): 10 mg via ORAL
  Filled 2015-03-16 (×4): qty 1

## 2015-03-16 MED ORDER — SODIUM CHLORIDE 0.9 % IJ SOLN
3.0000 mL | Freq: Two times a day (BID) | INTRAMUSCULAR | Status: DC
  Administered 2015-03-16 – 2015-03-17 (×2): 3 mL via INTRAVENOUS

## 2015-03-16 MED ORDER — FINASTERIDE 5 MG PO TABS
5.0000 mg | ORAL_TABLET | Freq: Every day | ORAL | Status: DC
  Administered 2015-03-16 – 2015-03-20 (×5): 5 mg via ORAL
  Filled 2015-03-16 (×5): qty 1

## 2015-03-16 MED ORDER — EZETIMIBE 10 MG PO TABS
10.0000 mg | ORAL_TABLET | Freq: Every day | ORAL | Status: DC
  Administered 2015-03-16 – 2015-03-20 (×5): 10 mg via ORAL
  Filled 2015-03-16 (×5): qty 1

## 2015-03-16 MED ORDER — AMLODIPINE BESYLATE 5 MG PO TABS
5.0000 mg | ORAL_TABLET | Freq: Once | ORAL | Status: AC
  Administered 2015-03-16: 5 mg via ORAL
  Filled 2015-03-16: qty 1

## 2015-03-16 MED ORDER — DEXTROSE 5 % IV SOLN
1.0000 g | INTRAVENOUS | Status: DC
  Administered 2015-03-16 – 2015-03-19 (×4): 1 g via INTRAVENOUS
  Filled 2015-03-16 (×4): qty 10

## 2015-03-16 MED ORDER — ISOSORBIDE MONONITRATE ER 60 MG PO TB24
60.0000 mg | ORAL_TABLET | Freq: Every day | ORAL | Status: DC
  Administered 2015-03-16 – 2015-03-20 (×5): 60 mg via ORAL
  Filled 2015-03-16 (×5): qty 1

## 2015-03-16 MED ORDER — AMLODIPINE BESYLATE 5 MG PO TABS
5.0000 mg | ORAL_TABLET | Freq: Every day | ORAL | Status: DC
  Administered 2015-03-16: 5 mg via ORAL
  Filled 2015-03-16: qty 1

## 2015-03-16 NOTE — Progress Notes (Signed)
Subjective: Patient was seen and examined at bedside this morning. He reports feeling SOB at rest and reports having cough which is occassionally productive of green sputum. Denies having any fevers or chills. O2 saturation 96% on partial nonrebreather mask this morning.   Objective: Vital signs in last 24 hours: Filed Vitals:   03/16/15 0639 03/16/15 0730 03/16/15 0800 03/16/15 0930  BP: 180/63 130/64 147/57 163/65  Pulse: 75 56 63 67  Temp:      TempSrc:      Resp:  25 24   SpO2:  95% 90% 95%   Weight change:   Intake/Output Summary (Last 24 hours) at 03/16/15 1118 Last data filed at 03/16/15 0126  Gross per 24 hour  Intake      0 ml  Output    250 ml  Net   -250 ml   Physical Exam  Constitutional: He is oriented to person, place, and time. He appears well-developed and well-nourished.  Sitting up in a stretcher. Appeared to be in mild respiratory distress  Cardiovascular: Normal rate and intact distal pulses.   Irregularly irregular rhythm   Pulmonary/Chest:  Bronchial breath sounds (L >R) Decreased breath sounds over right lung base Crackles heard over the L lung base  A constant tinkling sound was heard at the L lung base which seemed to be associated to the respiratory cycle.  No clubbing or cyanosis noted.   Abdominal: Soft. Bowel sounds are normal. He exhibits no distension. There is no tenderness.  Musculoskeletal:  +1 pitting edema of bilateral lower extremities   Neurological: He is alert and oriented to person, place, and time.  Skin: Skin is warm and dry.  Chronic venous stasis changes noted on bilateral lower extremities    Lab Results: Basic Metabolic Panel:  Recent Labs Lab 03/15/15 1627  NA 138  K 4.9  CL 105  CO2 25  GLUCOSE 261*  BUN 34*  CREATININE 1.90*  CALCIUM 8.9   Liver Function Tests:  Recent Labs Lab 03/15/15 1627  AST 19  ALT 18  ALKPHOS 60  BILITOT 1.9*  PROT 6.6  ALBUMIN 3.4*   CBC:  Recent Labs Lab  03/15/15 1627  WBC 13.6*  NEUTROABS 12.2*  HGB 11.0*  HCT 36.4*  MCV 90.8  PLT 185   CBG:  Recent Labs Lab 03/15/15 1626 03/16/15 0618 03/16/15 0814  GLUCAP 238* 171* 299*   Coagulation:  Recent Labs Lab 03/15/15 1627 03/16/15 0502  LABPROT 29.4* 26.9*  INR 2.85* 2.53*   Urinalysis:  Recent Labs Lab 03/15/15 1649  COLORURINE YELLOW  LABSPEC 1.020  PHURINE 5.5  GLUCOSEU 250*  HGBUR SMALL*  BILIRUBINUR NEGATIVE  KETONESUR NEGATIVE  PROTEINUR 100*  NITRITE NEGATIVE  LEUKOCYTESUR NEGATIVE   Micro Results: Recent Results (from the past 240 hour(s))  Culture, blood (routine x 2)     Status: None (Preliminary result)   Collection Time: 03/15/15 10:20 PM  Result Value Ref Range Status   Specimen Description BLOOD RIGHT ANTECUBITAL  Final   Special Requests BOTTLES DRAWN AEROBIC AND ANAEROBIC 5CC  Final   Culture NO GROWTH < 12 HOURS  Final   Report Status PENDING  Incomplete   Studies/Results: Dg Chest 2 View  03/16/2015  CLINICAL DATA:  Community acquired pneumonia EXAM: CHEST  2 VIEW COMPARISON:  None. FINDINGS: Upper normal heart size. Vascular congestion. Bilateral patchy airspace disease in a pulmonary edema pattern. No pneumothorax. IMPRESSION: Vascular congestion and bilateral airspace disease in a pulmonary edema pattern. The  heart is upper normal in size. Electronically Signed   By: Jolaine Click M.D.   On: 03/16/2015 09:07   Ct Chest Wo Contrast  03/15/2015  CLINICAL DATA:  81 year old male with a history of shortness of breath. Home oxygen. EXAM: CT CHEST WITHOUT CONTRAST TECHNIQUE: Multidetector CT imaging of the chest was performed following the standard protocol without IV contrast. COMPARISON:  Chest x-ray 03/15/2015, CT chest 04/05/2013 FINDINGS: Chest: Unremarkable appearance the superficial soft tissues. No axillary or supraclavicular adenopathy. Unremarkable appearance of the thoracic inlet. Central airways are clear. No tracheal or proximal  endobronchial debris. Unremarkable appearance of the esophagus. Global cardiomegaly. Calcifications of the aortic valve, mitral annulus, left main, left anterior descending, circumflex, and right coronary arteries. No pericardial fluid/ thickening. Compared to the prior CT, there are enlarged lymph nodes throughout the nodal stations of the mediastinum. Lymph nodes also present within the bilateral hilar regions, more pronounced on the left. Unremarkable course caliber and contour of the thoracic aorta with no aneurysm. Calcifications of the aortic arch and descending thoracic aorta. Calcifications of the branch vessels. Multifocal airspace opacities involving predominantly the left upper lobe and left lower lobe, and to a lesser degree the right upper lobe, right middle lobe, and right lower lobe. Moderate bilateral pleural effusions, including fluid within the left fissure. Mild interlobular septal thickening, more pronounced within the left upper lobe and left lower lobe. Atelectatic changes bilateral lower lobes, more pronounced on the right. Upper abdomen: Unremarkable appearance of liver and spleen. Cholelithiasis. Calcifications of the abdominal aorta. Musculoskeletal: No displaced fracture.  Degenerative changes of the thoracic spine. IMPRESSION: Left greater than right airspace disease and associated moderate pleural effusions, most concerning for multifocal pneumonia with reactive adenopathy and parapneumonic effusions. Superimposed edema/CHF not excluded, although the pattern of interstitial disease is favored to be infectious/inflammatory. Atherosclerosis with associated left main and 3 vessel coronary artery disease. Signed, Yvone Neu. Loreta Ave, DO [facility] Electronically Signed   By: Gilmer Mor D.O.   On: 03/15/2015 20:02   Dg Chest Portable 1 View  03/15/2015  CLINICAL DATA:  81 year old male with shortness of breath, hypoxia and cough  today. EXAM: PORTABLE CHEST 1 VIEW COMPARISON:  02/02/2013 chest radiograph.  04/05/2013 chest CT FINDINGS: The cardiomediastinal silhouette is unchanged. Elevation of the right hemidiaphragm is again noted. Pulmonary vascular congestion is identified. Airspace disease/ atelectasis in the left lower lung noted. There is no evidence of pneumothorax. IMPRESSION: Left lower lung atelectasis versus airspace disease/pneumonia. Electronically Signed   By: Harmon Pier M.D.   On: 03/15/2015 17:17   Medications: I have reviewed the patient's current medications. Scheduled Meds: . amLODipine  5 mg Oral Daily  . atorvastatin  40 mg Oral q1800  . cholecalciferol  1,000 Units Oral Daily  . ezetimibe  10 mg Oral Daily  . finasteride  5 mg Oral Daily  . furosemide  20 mg Oral BID  . insulin aspart  10 Units Subcutaneous TID WC  . insulin glargine  45 Units Subcutaneous Daily  . isosorbide mononitrate  60 mg Oral Daily  . metoprolol succinate  150 mg Oral Daily  . montelukast  10 mg Oral Daily  . pantoprazole  40 mg Oral Daily  . sodium chloride  3 mL Intravenous Q12H  . sodium chloride  3 mL Intravenous Q12H  . tamsulosin  0.4 mg Oral Daily  . warfarin  1 mg Oral q1800  . Warfarin - Pharmacist Dosing Inpatient  Does not apply q1800   Continuous Infusions: . azithromycin Stopped (03/16/15 0816)  . cefTRIAXone (ROCEPHIN)  IV Stopped (03/16/15 0739)   PRN Meds:. Assessment/Plan: Active Problems:   Diaphragmatic paralysis   CAP (community acquired pneumonia)   Diabetes mellitus (HCC)   HTN (hypertension)   Atrial fibrillation (HCC)   Bilateral lower extremity edema   Chronic kidney disease (CKD) stage 3   Bilateral pleural effusion  Terrence [name] is an 81 year old man with a history of right hemidiaphragm paralysis, hypoxemia, chronic atrial fibrillation, hypertension, hyperlipidemia, stage III chronic kidney disease, and type 2 diabetes who presents with acute on chronic hypoxic respiratory  failure.  Acute on chronic hypoxic respiratory failure  Patient with acutely worsening dyspnea over the past week with mental status changes and severe oxygen desaturation. Imaging showing ground-glass opacities and small pleural effusions. Also showing multi-station lymphadenopathy in the mediastinum and bilateral hilar lymphadenopathy. This could be secondary to an infectious process such as viral or atypical pneumonia or heart failure. Cor pulmonale might also be playing a role as indicated by chronic venous insufficiency and stasis dermatitis. Patient reports not using his home oxygen regularly which could have lead to chronic hypoxemia. In addition, patient has hypoxic respiratory failure due to shunt from R hemidiaphragm paralysis and R base consolidation. Chronic hypoxic pulmonary vasoconstriction can further lead to L heart failure. Acute worsening of patient's symptoms likely related to atypical CAP. At this point we are unable to do a CTA to assess his pulmonary vasculature due to his kidney disease; unable to do a V/Q scan due to his abnormal baseline CXR; unable to do PFTs due to his acute respiratory decompensation.  -Echo ordered to assess for both L and R heart ventricular function and PA pressure -Single dose of Lasix 20 mg IV to assess for his clinical response  -Continue oral Lasix 20 mg BID -Amlodipine has been increased to 10 mg daily to reduced afterload in case patient is found to have ventricular dysfunction from his pulmonary HTN -Educate patient about compliance to home O2 -Continue antibiotics ceftriaxone and azithromycin to treat his CAP -Blood cultures x2 -Urine strep, legionella -Continue O2 target SpO2 > 92% -Continuous pulse oximetry -Continue home montelukast 10 mg by mouth  Essential Hypertension: Blood pressure is 140s to 160s on presentation.  -Amlodipine has been increased to 10 mg daily to reduced afterload in case patient is found to have ventricular dysfunction  from his pulmonary HTN -Lasix 20 mg PO BID -Imdur 60 mg PO -Toprol-XL 150 mg PO  Type 2 diabetes mellitus: Patient has high insulin requirements at home using 66 units Lantus at night, and insulin NovoLog 12 units with breakfast and 16 units with lunch and 42 units at dinnertime. CBG in the 200s today.  -Lantus 45U qhs -Insulin aspart 10U TIDWM -SSI-resistant -CBGs TIDAC and qhs  Chronic atrial fibrillation: Patient on long-term Coumadin anticoagulation with target INR of 2.0-3.0 for atrial fibrillation. -PT/INR -Continue Coumadin, pharmacy consulted for dosing  BPH: Continue home tamsulosin 0.4 mg, finasteride 5 mg  GERD: Continue home pantoprazole 40 mg PO  Hyperlipidemia: Continue home atorvastatin 40mg , ezetimibe 10mg   Diet: Carb modified  DVT ppx: Coumadin  Code: Full   Dispo: Disposition is deferred at this time, awaiting improvement of current medical problems.  Anticipated discharge in approximately 2-3 day(s).   The patient does have a current PCP (Terrence A Corrington, MD) and does need an [facility] hospital follow-up appointment after discharge.  The patient does not have transportation limitations  that hinder transportation to clinic appointments.  .Services Needed at time of discharge: Y = Yes, Blank = No PT:   OT:   RN:   Equipment:   Other:     LOS: 0 days   Terrence GiovanniVasundhra Finlee Milo, MD 03/16/2015, 11:18 AM

## 2015-03-16 NOTE — Progress Notes (Signed)
ANTICOAGULATION CONSULT NOTE - Initial Consult  Pharmacy Consult for Coumadin Indication: atrial fibrillation  No Known Allergies   Vital Signs: Temp: 98.6 F (37 C) (12/17 1814) Temp Source: Rectal (12/17 1814) BP: 152/69 mmHg (12/18 0200) Pulse Rate: 54 (12/18 0200)  Labs:  Recent Labs  03/15/15 1627  HGB 11.0*  HCT 36.4*  PLT 185  LABPROT 29.4*  INR 2.85*  CREATININE 1.90*    Medical History: Past Medical History  Diagnosis Date  . Atrial fibrillation (HCC)   . Hypertension   . DM (diabetes mellitus) (HCC)   . Kidney failure   . Hyperlipidemia     Assessment: 81yo male c/o restlessness and disorientation, family wanted to bring pt to ED but pt became acutely SOB so EMS was called, admitted for CAP, to continue Coumadin for Afib; current INR therapeutic w/ last dose of Coumadin taken 12/16.  Goal of Therapy:  INR 2-3   Plan:  Will continue home Coumadin dose of 1mg  daily and monitor INR for dose adjustments.  Terrence [name], PharmD, BCPS  03/16/2015,2:45 AM

## 2015-03-16 NOTE — ED Notes (Signed)
CHECKED CBG 171, RN BOBBY INFORMED

## 2015-03-16 NOTE — ED Notes (Signed)
Pt returned from xray

## 2015-03-16 NOTE — ED Notes (Signed)
This RN called pt wife for him

## 2015-03-16 NOTE — Progress Notes (Addendum)
ANTICOAGULATION CONSULT NOTE - Follow Up Consult  Pharmacy Consult for coumadin Indication: atrial fibrillation  No Known Allergies   Vital Signs: Temp: 98.4 F (36.9 C) (12/18 1520) Temp Source: Oral (12/18 1520) BP: 154/66 mmHg (12/18 1520) Pulse Rate: 55 (12/18 1520)  Labs:  Recent Labs  03/15/15 1627 03/16/15 0502  HGB 11.0*  --   HCT 36.4*  --   PLT 185  --   LABPROT 29.4* 26.9*  INR 2.85* 2.53*  CREATININE 1.90*  --     CrCl cannot be calculated (Unknown ideal weight.).   Medications:  Scheduled:  . [START ON 03/17/2015] amLODipine  10 mg Oral Daily  . azithromycin  500 mg Intravenous Q24H  . cefTRIAXone (ROCEPHIN)  IV  1 g Intravenous Q24H  . cholecalciferol  1,000 Units Oral Daily  . ezetimibe  10 mg Oral Daily  . finasteride  5 mg Oral Daily  . furosemide  20 mg Oral BID  . insulin aspart  10 Units Subcutaneous TID WC  . insulin glargine  45 Units Subcutaneous Daily  . isosorbide mononitrate  60 mg Oral Daily  . metoprolol succinate  150 mg Oral Daily  . montelukast  10 mg Oral Daily  . pantoprazole  40 mg Oral Daily  . sodium chloride  3 mL Intravenous Q12H  . sodium chloride  3 mL Intravenous Q12H  . tamsulosin  0.4 mg Oral Daily  . warfarin  1 mg Oral q1800  . Warfarin - Pharmacist Dosing Inpatient   Does not apply q1800    Assessment: 81yo male c/o restlessness and disorientation, family wanted to bring pt to ED but pt became acutely SOB so EMS was called, admitted for CAP, to continue Coumadin for Afib. -INR= 2.53  Home coumadin dose: 1mg  po daily  Goal of Therapy:  INR 2-3 Monitor platelets by anticoagulation protocol: Yes   Plan:  -Coumadin 1mg  po daily -Daily PT/INR  Harland GermanAndrew Ryan Ogborn, Pharm D 03/16/2015 3:24 PM

## 2015-03-16 NOTE — ED Notes (Signed)
Patient transported to X-ray 

## 2015-03-16 NOTE — ED Notes (Signed)
Requested medication from pharmacy.

## 2015-03-16 NOTE — ED Notes (Signed)
Pt off unit with xray 

## 2015-03-17 ENCOUNTER — Inpatient Hospital Stay (HOSPITAL_COMMUNITY): Payer: Medicare Other

## 2015-03-17 DIAGNOSIS — E1159 Type 2 diabetes mellitus with other circulatory complications: Secondary | ICD-10-CM

## 2015-03-17 DIAGNOSIS — I509 Heart failure, unspecified: Secondary | ICD-10-CM

## 2015-03-17 DIAGNOSIS — N184 Chronic kidney disease, stage 4 (severe): Secondary | ICD-10-CM

## 2015-03-17 DIAGNOSIS — J90 Pleural effusion, not elsewhere classified: Secondary | ICD-10-CM

## 2015-03-17 DIAGNOSIS — R0902 Hypoxemia: Secondary | ICD-10-CM | POA: Insufficient documentation

## 2015-03-17 LAB — BASIC METABOLIC PANEL
ANION GAP: 7 (ref 5–15)
BUN: 39 mg/dL — AB (ref 6–20)
CALCIUM: 8.7 mg/dL — AB (ref 8.9–10.3)
CO2: 28 mmol/L (ref 22–32)
CREATININE: 1.86 mg/dL — AB (ref 0.61–1.24)
Chloride: 103 mmol/L (ref 101–111)
GFR calc Af Amer: 36 mL/min — ABNORMAL LOW (ref >60)
GFR calc non Af Amer: 31 mL/min — ABNORMAL LOW (ref >60)
GLUCOSE: 158 mg/dL — AB (ref 65–99)
Potassium: 4.5 mmol/L (ref 3.5–5.1)
Sodium: 138 mmol/L (ref 135–145)

## 2015-03-17 LAB — GLUCOSE, CAPILLARY
GLUCOSE-CAPILLARY: 149 mg/dL — AB (ref 65–99)
GLUCOSE-CAPILLARY: 63 mg/dL — AB (ref 65–99)
Glucose-Capillary: 203 mg/dL — ABNORMAL HIGH (ref 65–99)
Glucose-Capillary: 180 mg/dL — ABNORMAL HIGH (ref 65–99)
Glucose-Capillary: 76 mg/dL (ref 65–99)

## 2015-03-17 LAB — CBC
HEMATOCRIT: 33.3 % — AB (ref 39.0–52.0)
Hemoglobin: 9.8 g/dL — ABNORMAL LOW (ref 13.0–17.0)
MCH: 27.3 pg (ref 26.0–34.0)
MCHC: 29.4 g/dL — AB (ref 30.0–36.0)
MCV: 92.8 fL (ref 78.0–100.0)
Platelets: 184 10*3/uL (ref 150–400)
RBC: 3.59 MIL/uL — ABNORMAL LOW (ref 4.22–5.81)
RDW: 16.9 % — AB (ref 11.5–15.5)
WBC: 12.5 10*3/uL — ABNORMAL HIGH (ref 4.0–10.5)

## 2015-03-17 LAB — LEGIONELLA ANTIGEN, URINE

## 2015-03-17 LAB — PROTIME-INR
INR: 2.87 — ABNORMAL HIGH (ref 0.00–1.49)
Prothrombin Time: 29.6 seconds — ABNORMAL HIGH (ref 11.6–15.2)

## 2015-03-17 MED ORDER — FUROSEMIDE 10 MG/ML IJ SOLN
40.0000 mg | Freq: Once | INTRAMUSCULAR | Status: AC
  Administered 2015-03-17: 40 mg via INTRAVENOUS
  Filled 2015-03-17: qty 4

## 2015-03-17 MED ORDER — IPRATROPIUM-ALBUTEROL 0.5-2.5 (3) MG/3ML IN SOLN
3.0000 mL | Freq: Four times a day (QID) | RESPIRATORY_TRACT | Status: DC
  Administered 2015-03-17 – 2015-03-20 (×11): 3 mL via RESPIRATORY_TRACT
  Filled 2015-03-17 (×12): qty 3

## 2015-03-17 MED ORDER — FUROSEMIDE 20 MG PO TABS
20.0000 mg | ORAL_TABLET | Freq: Two times a day (BID) | ORAL | Status: DC
  Administered 2015-03-17: 20 mg via ORAL
  Filled 2015-03-17: qty 1

## 2015-03-17 NOTE — Progress Notes (Signed)
Patient ID: Terrence [name], male   DOB: 1926-09-07, 81 y.o.   MRN: [id number]  Medicine attending:  I personally interviewed and examined this patient today and reviewed pertinent clinical , laboratory, and x-ray data and I attest to the accuracy of the evaluation and management plan as recorded by resident physician Dr. Eleazar GiovanniVasundhra Rathore which we discussed together.   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 On arrival  at the patient's home , EMS team recorded an oxygen saturation of about 80% on room air. He was initially put on a non rebreather oxygen mask. Portable chest x-ray showed in addition to the elevated right hemidiaphragm, left lower lung atelectasis versus early pneumonia and pulmonary vascular congestion. A CT scan of the chest shows much more dramatic abnormalities including, cardiomegaly, calcified coronary arteries, calcified aortic valve, mediastinal lymphadenopathy, size not stated,impressive multi focal airspace disease involving left upper lobe, left lower lobe,where there are ground glass type infiltrates, and to a lesser degree right upper lobe, middle lobe, and lower lobe. Bilateral pleural effusions.radiologist reading this as being most consistent with multifocal pneumonia with associated reactive adenopathy and parapneumonic effusions.BNP only borderline increased at 401.no comparison value available.no elevation of troponin. Chronic atrial fibrillation on EKG. Right axis deviation. Occasional PVC. He was started on antibiotics to cover a community-acquired pneumonia. Clinical condition has stabilized. He has been able  to transition successfully to  nasal cannula oxygen and is maintaining oxygen saturations over 90%he was afebrile on admission and remains so at this time.  initial white count 13,600 with 90% neutrophils. He appears comfortable at rest. There are rales bilaterally over both lung bases left greater than right. Irregularly irregular cardiac rhythm. No peripheral edema. Venous stasis changes. Plan is to continue current management. At some point it might be important to initiate a goals of care discussion with this 81 year old man with multiple comorbid medical conditions.

## 2015-03-17 NOTE — Progress Notes (Signed)
   03/17/15 1454  Clinical Encounter Type  Visited With Patient;Health care provider  Visit Type Initial  Referral From Nurse  Stress Factors  Patient Stress Factors Lack of knowledge   Chaplain responded to a consult from a nurse to visit the patient. Patient seems fine for now, other than being uncertain about what is causing his shortness of breath. Chaplain offered support, and our support is available as needed.   Alda PonderAdam M Paxton Kanaan, Chaplain 03/17/2015 2:55 PM

## 2015-03-17 NOTE — Progress Notes (Signed)
  Echocardiogram 2D Echocardiogram has been performed.  [name], Terrence Everage 03/17/2015, 3:57 PM 

## 2015-03-17 NOTE — Progress Notes (Signed)
  Echocardiogram 2D Echocardiogram has been performed.  Arvil ChacoFoster, Kyon Bentler 03/17/2015, 3:57 PM

## 2015-03-17 NOTE — Progress Notes (Signed)
ANTICOAGULATION CONSULT NOTE - Follow Up Consult  Pharmacy Consult for warfarin Indication: atrial fibrillation  No Known Allergies  Patient Measurements: Height: 6\' 3"  (190.5 cm) Weight: 256 lb 6.3 oz (116.3 kg) IBW/kg (Calculated) : 84.5  Vital Signs: Temp: 97.6 F (36.4 C) (12/19 0843) Temp Source: Oral (12/19 0843) BP: 147/64 mmHg (12/19 0843) Pulse Rate: 71 (12/19 0843)  Labs:  Recent Labs  03/15/15 1627 03/16/15 0502 03/17/15 0416  HGB 11.0*  --  9.8*  HCT 36.4*  --  33.3*  PLT 185  --  184  LABPROT 29.4* 26.9* 29.6*  INR 2.85* 2.53* 2.87*  CREATININE 1.90*  --  1.86*    Estimated Creatinine Clearance: 39.2 mL/min (by C-G formula based on Cr of 1.86).   Medications:  Scheduled:  . amLODipine  10 mg Oral Daily  . atorvastatin  40 mg Oral q1800  . azithromycin  500 mg Intravenous Q24H  . cefTRIAXone (ROCEPHIN)  IV  1 g Intravenous Q24H  . cholecalciferol  1,000 Units Oral Daily  . ezetimibe  10 mg Oral Daily  . finasteride  5 mg Oral Daily  . furosemide  40 mg Intravenous Once  . insulin aspart  10 Units Subcutaneous TID WC  . insulin glargine  45 Units Subcutaneous Daily  . isosorbide mononitrate  60 mg Oral Daily  . metoprolol succinate  150 mg Oral Daily  . montelukast  10 mg Oral Daily  . pantoprazole  40 mg Oral Daily  . sodium chloride  3 mL Intravenous Q12H  . sodium chloride  3 mL Intravenous Q12H  . tamsulosin  0.4 mg Oral Daily  . warfarin  1 mg Oral q1800  . Warfarin - Pharmacist Dosing Inpatient   Does not apply q1800    Assessment: 81yo M admitted 03/15/2015 with CAP. On warfarin PTA for Afib, pharmacy consulted to continue warfarin.   INR 2.87, Hgb 11>9.8, Plt wnl, No s/sx of bleeding noted.  PTA warfarin dose: 1 mg daily  Goal of Therapy:  INR 2-3 Monitor platelets by anticoagulation protocol: Yes   Plan:  - Warfarin 1 mg PO daily - Monitor daily INR, CBC and s/sx of bleeding  Casilda Carlsaylor Morad Tal, PharmD. PGY-1 Pharmacy  Resident Pager: [phone] 03/17/2015,9:02 AM

## 2015-03-17 NOTE — Progress Notes (Signed)
Utilization Review Completed.Marlene Beidler T12/19/2016  

## 2015-03-17 NOTE — Progress Notes (Addendum)
Subjective: Patient was seen and examined at bedside this morning. He reports feeling SOB at rest and reports having cough which is occassionally productive of green sputum. Denies having any fevers or chills. This morning his O2 saturation was in the lower 90s on 5L O2 via [place] and saturation was dropping to the high 80s when the patient was talking.   Objective: Vital signs in last 24 hours: Filed Vitals:   03/16/15 1641 03/16/15 2229 03/17/15 0538 03/17/15 0843  BP:  128/55 153/59 147/64  Pulse:  57 59 71  Temp:  97.7 F (36.5 C) 98.4 F (36.9 C) 97.6 F (36.4 C)  TempSrc:  Oral Oral Oral  Resp:  20 20 18   Height: 6\' 3"  (1.905 m)     Weight: 116.3 kg (256 lb 6.3 oz)     SpO2:  100% 98% 92%   Weight change:   Intake/Output Summary (Last 24 hours) at 03/17/15 1410 Last data filed at 03/17/15 1005  Gross per 24 hour  Intake   1200 ml  Output    550 ml  Net    650 ml   Physical Exam  Constitutional: He is oriented to person, place, and time. He appears well-developed and well-nourished.  Sitting up in hospital bed. No acute distress.   Cardiovascular: Normal rate and intact distal pulses.   Irregularly irregular rhythm   Pulmonary/Chest:  Bronchial breath sounds (L >R) Decreased breath sounds over right lung base Mild crackles heard diffusely.  No clubbing or cyanosis noted.   Abdominal: Soft. Bowel sounds are normal. He exhibits no distension. There is no tenderness.  Musculoskeletal:  Trace pitting edema of bilateral lower extremities   Neurological: He is alert and oriented to person, place, and time.  Skin: Skin is warm and dry.  Chronic venous stasis changes noted on bilateral lower extremities    Lab Results: Basic Metabolic Panel:  Recent Labs Lab 03/15/15 1627 03/17/15 0416  NA 138 138  K 4.9 4.5  CL 105 103  CO2 25 28  GLUCOSE 261* 158*  BUN 34* 39*  CREATININE 1.90* 1.86*  CALCIUM 8.9 8.7*   Liver Function Tests:  Recent Labs Lab  03/15/15 1627  AST 19  ALT 18  ALKPHOS 60  BILITOT 1.9*  PROT 6.6  ALBUMIN 3.4*   CBC:  Recent Labs Lab 03/15/15 1627 03/17/15 0416  WBC 13.6* 12.5*  NEUTROABS 12.2*  --   HGB 11.0* 9.8*  HCT 36.4* 33.3*  MCV 90.8 92.8  PLT 185 184   CBG:  Recent Labs Lab 03/16/15 0814 03/16/15 1227 03/16/15 1639 03/16/15 2301 03/17/15 0753 03/17/15 1131  GLUCAP 299* 200* 98 149* 203* 180*   Coagulation:  Recent Labs Lab 03/15/15 1627 03/16/15 0502 03/17/15 0416  LABPROT 29.4* 26.9* 29.6*  INR 2.85* 2.53* 2.87*   Urinalysis:  Recent Labs Lab 03/15/15 1649  COLORURINE YELLOW  LABSPEC 1.020  PHURINE 5.5  GLUCOSEU 250*  HGBUR SMALL*  BILIRUBINUR NEGATIVE  KETONESUR NEGATIVE  PROTEINUR 100*  NITRITE NEGATIVE  LEUKOCYTESUR NEGATIVE   Micro Results: Recent Results (from the past 240 hour(s))  Culture, blood (routine x 2)     Status: None (Preliminary result)   Collection Time: 03/15/15 10:20 PM  Result Value Ref Range Status   Specimen Description BLOOD RIGHT ANTECUBITAL  Final   Special Requests BOTTLES DRAWN AEROBIC AND ANAEROBIC 5CC  Final   Culture NO GROWTH 2 DAYS  Final   Report Status PENDING  Incomplete  Culture, blood (routine  x 2)     Status: None (Preliminary result)   Collection Time: 03/16/15 12:24 AM  Result Value Ref Range Status   Specimen Description BLOOD RIGHT ARM  Final   Special Requests BOTTLES DRAWN AEROBIC ONLY 10CC POF ZITHROMAX  Final   Culture NO GROWTH 1 DAY  Final   Report Status PENDING  Incomplete   Studies/Results: Dg Chest 2 View  03/16/2015  CLINICAL DATA:  Community acquired pneumonia EXAM: CHEST  2 VIEW COMPARISON:  None. FINDINGS: Upper normal heart size. Vascular congestion. Bilateral patchy airspace disease in a pulmonary edema pattern. No pneumothorax. IMPRESSION: Vascular congestion and bilateral airspace disease in a pulmonary edema pattern. The heart is upper normal in size. Electronically Signed   By: Jolaine Click  M.D.   On: 03/16/2015 09:07   Ct Chest Wo Contrast  03/15/2015  CLINICAL DATA:  81 year old male with a history of shortness of breath. Home oxygen. EXAM: CT CHEST WITHOUT CONTRAST TECHNIQUE: Multidetector CT imaging of the chest was performed following the standard protocol without IV contrast. COMPARISON:  Chest x-ray 03/15/2015, CT chest 04/05/2013 FINDINGS: Chest: Unremarkable appearance the superficial soft tissues. No axillary or supraclavicular adenopathy. Unremarkable appearance of the thoracic inlet. Central airways are clear. No tracheal or proximal endobronchial debris. Unremarkable appearance of the esophagus. Global cardiomegaly. Calcifications of the aortic valve, mitral annulus, left main, left anterior descending, circumflex, and right coronary arteries. No pericardial fluid/ thickening. Compared to the prior CT, there are enlarged lymph nodes throughout the nodal stations of the mediastinum. Lymph nodes also present within the bilateral hilar regions, more pronounced on the left. Unremarkable course caliber and contour of the thoracic aorta with no aneurysm. Calcifications of the aortic arch and descending thoracic aorta. Calcifications of the branch vessels. Multifocal airspace opacities involving predominantly the left upper lobe and left lower lobe, and to a lesser degree the right upper lobe, right middle lobe, and right lower lobe. Moderate bilateral pleural effusions, including fluid within the left fissure. Mild interlobular septal thickening, more pronounced within the left upper lobe and left lower lobe. Atelectatic changes bilateral lower lobes, more pronounced on the right. Upper abdomen: Unremarkable appearance of liver and spleen. Cholelithiasis. Calcifications of the abdominal aorta. Musculoskeletal: No displaced fracture.  Degenerative changes of the thoracic spine. IMPRESSION: Left greater than right airspace disease and associated moderate pleural effusions, most concerning for  multifocal pneumonia with reactive adenopathy and parapneumonic effusions. Superimposed edema/CHF not excluded, although the pattern of interstitial disease is favored to be infectious/inflammatory. Atherosclerosis with associated left main and 3 vessel coronary artery disease. Signed, Yvone Neu. Loreta Ave, DO [facility] Electronically Signed   By: Gilmer Mor D.O.   On: 03/15/2015 20:02   Dg Chest Portable 1 View  03/15/2015  CLINICAL DATA:  81 year old male with shortness of breath, hypoxia and cough today. EXAM: PORTABLE CHEST 1 VIEW COMPARISON:  02/02/2013 chest radiograph.  04/05/2013 chest CT FINDINGS: The cardiomediastinal silhouette is unchanged. Elevation of the right hemidiaphragm is again noted. Pulmonary vascular congestion is identified. Airspace disease/ atelectasis in the left lower lung noted. There is no evidence of pneumothorax. IMPRESSION: Left lower lung atelectasis versus airspace disease/pneumonia. Electronically Signed   By: Harmon Pier M.D.   On: 03/15/2015 17:17   Medications: I have reviewed the patient's current medications. Scheduled Meds: . amLODipine  10 mg Oral Daily  . atorvastatin  40 mg Oral q1800  . azithromycin  500 mg Intravenous Q24H  . cefTRIAXone (ROCEPHIN)  IV  1 g Intravenous Q24H  . cholecalciferol  1,000 Units Oral Daily  . ezetimibe  10 mg Oral Daily  . finasteride  5 mg Oral Daily  . insulin aspart  10 Units Subcutaneous TID WC  . insulin glargine  45 Units Subcutaneous Daily  . isosorbide mononitrate  60 mg Oral Daily  . metoprolol succinate  150 mg Oral Daily  . montelukast  10 mg Oral Daily  . pantoprazole  40 mg Oral Daily  . sodium chloride  3 mL Intravenous Q12H  . sodium chloride  3 mL Intravenous Q12H  . tamsulosin  0.4 mg Oral Daily  . warfarin  1 mg Oral q1800  . Warfarin - Pharmacist Dosing Inpatient   Does not apply q1800   Continuous Infusions:   PRN  Meds:. Assessment/Plan: Active Problems:   Diaphragmatic paralysis   CAP (community acquired pneumonia)   Diabetes mellitus (HCC)   HTN (hypertension)   Atrial fibrillation (HCC)   Bilateral lower extremity edema   Chronic kidney disease (CKD) stage 3   Bilateral pleural effusion   Community acquired pneumonia  Terrence [name] is an 81 year old man with a history of right hemidiaphragm paralysis, hypoxemia, chronic atrial fibrillation, hypertension, hyperlipidemia, stage III chronic kidney disease, and type 2 diabetes who presents with acute on chronic hypoxic respiratory failure.  Acute on chronic hypoxic respiratory failure  Patient with acutely worsening dyspnea over the past week with mental status changes and severe oxygen desaturation. Imaging showing ground-glass opacities and small pleural effusions. Also showing multi-station lymphadenopathy in the mediastinum and bilateral hilar lymphadenopathy. This could be secondary to an infectious process such as viral or atypical pneumonia or heart failure. Cor pulmonale might also be playing a role as indicated by chronic venous insufficiency and stasis dermatitis. Patient reports not using his home oxygen regularly which could have lead to chronic hypoxemia. In addition, patient has hypoxic respiratory failure due to shunt from R hemidiaphragm paralysis and R base consolidation. Chronic hypoxic pulmonary vasoconstriction can further lead to L heart failure. Acute worsening of patient's symptoms likely related to atypical CAP. At this point we are unable to do a CTA to assess his pulmonary vasculature due to his kidney disease; unable to do a V/Q scan due to his abnormal baseline CXR; unable to do PFTs due to his acute respiratory decompensation. Patient was given a single dose of IV Lasix 20 mg and continued on Lasix  20 mg BID yesterday. This morning his O2 saturation was in the lower 90s on 5L O2 via [place] and saturation was dropping to the high 80s  when the patient was talking. Blood culture showing no growth in 2 days. Urine legionella and strep pneumo negative.  -Echo ordered to assess for both L and R heart ventricular function and PA pressure. Echo still pending.  -Give one dose of Lasix 40 mg IV today  -cont Amlodipine 10 mg daily to reduced afterload in case patient is found to have ventricular dysfunction from his pulmonary HTN -Educate patient about compliance to home O2 -Continue antibiotics ceftriaxone and azithromycin to treat his CAP -F/u final blood culture results  -F/u sputum culture  -Continue O2 target SpO2 > 92% -Continuous pulse oximetry -Continue home montelukast 10 mg by mouth -Started Duoneb q6 prn  Essential Hypertension: Blood pressure is 140s to 160s on presentation.  -cont Amlodipine 10 mg daily  -Lasix 20 mg PO BID -Imdur 60 mg PO -Toprol-XL 150 mg PO  Type 2 diabetes mellitus: Patient has  high insulin requirements at home using 66 units Lantus at night, and insulin NovoLog 12 units with breakfast and 16 units with lunch and 42 units at dinnertime. CBG 158 today.  -Lantus 45U qhs -Insulin aspart 10U TIDWM -SSI-resistant -CBGs TIDAC and qhs  Chronic atrial fibrillation: Patient on long-term Coumadin anticoagulation with target INR of 2.0-3.0 for atrial fibrillation. -PT/INR -Continue Coumadin, pharmacy consulted for dosing  BPH: Continue home tamsulosin 0.4 mg, finasteride 5 mg  GERD: Continue home pantoprazole 40 mg PO  Hyperlipidemia: Continue home atorvastatin , ezetimibe   Diet: Carb modified  DVT ppx: Coumadin  Code: Full   Dispo: Disposition is deferred at this time, awaiting improvement of current medical problems.  Anticipated discharge in approximately 2-3 day(s).   The patient does have a current PCP (Terrence A Corrington, MD) and does need an [facility] hospital follow-up appointment after discharge.  The patient does not have transportation limitations that hinder transportation to  clinic appointments.  .Services Needed at time of discharge: Y = Yes, Blank = No PT:   OT:   RN:   Equipment:   Other:     LOS: 1 day   Lily Giovanni, MD 03/17/2015, 2:10 PM

## 2015-03-18 DIAGNOSIS — D649 Anemia, unspecified: Secondary | ICD-10-CM

## 2015-03-18 LAB — RENAL FUNCTION PANEL
Albumin: 2.8 g/dL — ABNORMAL LOW (ref 3.5–5.0)
Anion gap: 13 (ref 5–15)
BUN: 45 mg/dL — AB (ref 6–20)
CHLORIDE: 101 mmol/L (ref 101–111)
CO2: 23 mmol/L (ref 22–32)
CREATININE: 1.97 mg/dL — AB (ref 0.61–1.24)
Calcium: 8.9 mg/dL (ref 8.9–10.3)
GFR calc Af Amer: 34 mL/min — ABNORMAL LOW (ref >60)
GFR calc non Af Amer: 29 mL/min — ABNORMAL LOW (ref >60)
GLUCOSE: 123 mg/dL — AB (ref 65–99)
POTASSIUM: 4.4 mmol/L (ref 3.5–5.1)
Phosphorus: 4.4 mg/dL (ref 2.5–4.6)
Sodium: 137 mmol/L (ref 135–145)

## 2015-03-18 LAB — CBC
HEMATOCRIT: 30.3 % — AB (ref 39.0–52.0)
Hemoglobin: 9.2 g/dL — ABNORMAL LOW (ref 13.0–17.0)
MCH: 28 pg (ref 26.0–34.0)
MCHC: 30.4 g/dL (ref 30.0–36.0)
MCV: 92.4 fL (ref 78.0–100.0)
Platelets: 162 10*3/uL (ref 150–400)
RBC: 3.28 MIL/uL — ABNORMAL LOW (ref 4.22–5.81)
RDW: 17 % — AB (ref 11.5–15.5)
WBC: 9.5 10*3/uL (ref 4.0–10.5)

## 2015-03-18 LAB — EXPECTORATED SPUTUM ASSESSMENT W REFEX TO RESP CULTURE

## 2015-03-18 LAB — PROTIME-INR
INR: 2.66 — AB (ref 0.00–1.49)
PROTHROMBIN TIME: 28 s — AB (ref 11.6–15.2)

## 2015-03-18 LAB — GLUCOSE, CAPILLARY
GLUCOSE-CAPILLARY: 168 mg/dL — AB (ref 65–99)
GLUCOSE-CAPILLARY: 187 mg/dL — AB (ref 65–99)
GLUCOSE-CAPILLARY: 79 mg/dL (ref 65–99)
Glucose-Capillary: 304 mg/dL — ABNORMAL HIGH (ref 65–99)

## 2015-03-18 MED ORDER — INSULIN ASPART 100 UNIT/ML ~~LOC~~ SOLN: 0.0000 [IU] | Freq: Every day | SUBCUTANEOUS | Status: DC

## 2015-03-18 MED ORDER — INSULIN ASPART 100 UNIT/ML ~~LOC~~ SOLN
0.0000 [IU] | Freq: Three times a day (TID) | SUBCUTANEOUS | Status: DC
  Administered 2015-03-18: 11 [IU] via SUBCUTANEOUS
  Administered 2015-03-18 – 2015-03-19 (×2): 3 [IU] via SUBCUTANEOUS
  Administered 2015-03-19 (×2): 2 [IU] via SUBCUTANEOUS
  Administered 2015-03-20: 3 [IU] via SUBCUTANEOUS

## 2015-03-18 MED ORDER — FUROSEMIDE 20 MG PO TABS
20.0000 mg | ORAL_TABLET | Freq: Two times a day (BID) | ORAL | Status: DC
  Administered 2015-03-18 – 2015-03-20 (×4): 20 mg via ORAL
  Filled 2015-03-18 (×4): qty 1

## 2015-03-18 NOTE — Evaluation (Signed)
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   Clinical Impression  Pt admitted with above. Pt currently on 15Lo2 via mask but on 2LO2 via [place] at home. Pt functioning at min guard level. SpO2 decreased to lows 80s during ambulation on 15LO2 via [place]. Pt on [place] while eating on 15LO2 and sating at highs 80s, low 90s. Talking to RN about high flow O2 tubing. Once breathing under control suspect pt will be safe to d/c home with family.    Follow Up Recommendations Home health PT;Supervision/Assistance - 24 hour    Equipment Recommendations  Rolling walker with 5" wheels    Recommendations for Other Services       Precautions / Restrictions Precautions Precautions: Fall Precaution Comments: SOB, desat with any mvmt Restrictions Weight Bearing Restrictions: No      Mobility  Bed Mobility Overal bed mobility: Modified Independent             General bed mobility comments: HOB elevated, used bedrail, increased time  Transfers Overall transfer level: Needs assistance Equipment used: Rolling walker (2 wheeled) Transfers: Sit to/from Stand Sit to Stand: Min assist         General transfer comment: v/c's for safe hand placement, walker management  Ambulation/Gait Ambulation/Gait assistance: Min guard Ambulation Distance (Feet): 120 Feet Assistive device: Rolling walker (2 wheeled) Gait Pattern/deviations: Step-through  pattern Gait velocity: decrased Gait velocity interpretation: Below normal speed for age/gender General Gait Details: v/c's for walker management due to not using RW typically. Pt SpO2 decreased into lows 80s on 15Lo2 via [place], mild SOB  Stairs            Wheelchair Mobility    Modified Rankin (Stroke Patients Only)       Balance Overall balance assessment: Needs assistance Sitting-balance support: Feet supported;No upper extremity supported Sitting balance-Leahy Scale: Good     Standing balance support: No upper extremity supported Standing balance-Leahy Scale: Fair Standing balance comment: static standing okay but requires assist for ambulation                             Pertinent Vitals/Pain Pain Assessment: No/denies pain    Home Living Family/patient expects to be discharged to:: Private residence Living Arrangements: Spouse/significant other Available Help at Discharge: Family;Available 24 hours/day Type of Home:  (condo) Home Access: Stairs to enter Entrance Stairs-Rails: Right Entrance Stairs-Number of Steps: 5 Home Layout: One level Home Equipment: Cane - quad;Shower seat - built in;Grab bars - tub/shower;Grab bars - toilet      Prior Function Level of Independence: Independent         Comments: uses cane intermittently, due to DOE pt amulating short distances PTA     Hand Dominance   Dominant Hand: Right    Extremity/Trunk Assessment   Upper Extremity Assessment: Overall WFL for tasks assessed           Lower Extremity Assessment: Generalized weakness      Cervical / Trunk Assessment: Normal  Communication   Communication: No difficulties  Cognition Arousal/Alertness: Awake/alert Behavior During Therapy: WFL for tasks assessed/performed Overall Cognitive Status: Within Functional Limits for tasks assessed                      General Comments      Exercises        Assessment/Plan    PT Assessment  Patient needs continued PT services  PT Diagnosis Difficulty walking;Generalized weakness   PT Problem List Decreased strength;Decreased range of motion;Decreased activity tolerance;Decreased balance;Decreased mobility;Decreased coordination  PT Treatment Interventions DME instruction;Gait training;Stair training;Functional mobility training;Therapeutic activities;Therapeutic exercise   PT Goals (Current goals can be found in the Care Plan section) Acute Rehab PT Goals Patient Stated Goal: get better PT Goal Formulation: With patient Time For Goal Achievement: 03/25/15 Potential to Achieve Goals: Good    Frequency Min 3X/week   Barriers to discharge        Co-evaluation               End of Session Equipment Utilized During Treatment: Gait belt;Oxygen (15LO2 via [place]) Activity Tolerance: Patient tolerated treatment well Patient left: in chair;with call bell/phone within reach;with chair alarm set Nurse Communication: Mobility status         Time: [id number] PT Time Calculation (min) (ACUTE ONLY): 41 min   Charges:   PT Evaluation $Initial PT Evaluation Tier I: 1 Procedure PT Treatments $Gait Training: 8-22 mins $Therapeutic Activity: 8-22 mins   PT G CodesMarcene [name]:        Terrence [name] 03/18/2015, 8:36 AM   Terrence ShockAshly Armentha [name], PT, DPT Pager #: [phone] Office #: [phone]

## 2015-03-18 NOTE — Progress Notes (Signed)
Patient had 5 runs of Vtach earlier today, MD notified.

## 2015-03-18 NOTE — Progress Notes (Signed)
Lab called for the sputum culture and said it is no good and needs a new specimen.

## 2015-03-18 NOTE — Progress Notes (Signed)
Subjective: Patient was seen and examined at bedside this morning. He was eating his breakfast and reported feeling well. He denied having any SOB, cough, fevers or chills. No other complaints. Patient has been deescalated to 4L O2 via [place] and is satting in the lower 90s.   Objective: Vital signs in last 24 hours: Filed Vitals:   03/17/15 2117 03/18/15 0101 03/18/15 0532 03/18/15 0821  BP: 124/76  130/66 140/75  Pulse: 73  56 51  Temp: 97.6 F (36.4 C)  98 F (36.7 C) 98.4 F (36.9 C)  TempSrc: Oral  Axillary Oral  Resp: Height:      Weight:      SpO2: 95% 97% 100% 100%   Weight change:   Intake/Output Summary (Last 24 hours) at 03/18/15 0837 Last data filed at 03/18/15 1610  Gross per 24 hour  Intake   1020 ml  Output    420 ml  Net    600 ml   Physical Exam  Constitutional: He is oriented to person, place, and time. He appears well-developed and well-nourished.  OOB sitting in chair eating breakfast. No acute distress.   Cardiovascular: Normal rate and intact distal pulses.   Irregularly irregular rhythm   Pulmonary/Chest:  Decreased breath sounds over right lung base Mild crackles heard diffusely.  No clubbing or cyanosis noted.   Abdominal: Soft. Bowel sounds are normal. He exhibits no distension. There is no tenderness.  Musculoskeletal:  Trace pitting edema of bilateral lower extremities   Neurological: He is alert and oriented to person, place, and time.  Skin: Skin is warm and dry.  Chronic venous stasis changes noted on bilateral lower extremities    Lab Results: Basic Metabolic Panel:  Recent Labs Lab 03/17/15 0416 03/18/15 0645  NA 138 137  K 4.5 4.4  CL 103 101  CO2 28 23  GLUCOSE 158* 123*  BUN 39* 45*  CREATININE 1.86* 1.97*  CALCIUM 8.7* 8.9  PHOS  --  4.4   Liver Function Tests:  Recent Labs Lab 03/15/15 1627 03/18/15 0645  AST 19  --   ALT 18  --   ALKPHOS 60  --   BILITOT 1.9*  --   PROT 6.6  --   ALBUMIN 3.4*  2.8*   CBC:  Recent Labs Lab 03/15/15 1627 03/17/15 0416 03/18/15 0645  WBC 13.6* 12.5* 9.5  NEUTROABS 12.2*  --   --   HGB 11.0* 9.8* 9.2*  HCT 36.4* 33.3* 30.3*  MCV 90.8 92.8 92.4  PLT 185 184 162   CBG:  Recent Labs Lab 03/16/15 2301 03/17/15 0753 03/17/15 1131 03/17/15 1603 03/17/15 1729 03/18/15 0730  GLUCAP 149* 203* 180* 63* 76 168*   Coagulation:  Recent Labs Lab 03/15/15 1627 03/16/15 0502 03/17/15 0416 03/18/15 0645  LABPROT 29.4* 26.9* 29.6* 28.0*  INR 2.85* 2.53* 2.87* 2.66*   Urinalysis:  Recent Labs Lab 03/15/15 1649  COLORURINE YELLOW  LABSPEC 1.020  PHURINE 5.5  GLUCOSEU 250*  HGBUR SMALL*  BILIRUBINUR NEGATIVE  KETONESUR NEGATIVE  PROTEINUR 100*  NITRITE NEGATIVE  LEUKOCYTESUR NEGATIVE   Micro Results: Recent Results (from the past 240 hour(s))  Culture, blood (routine x 2)     Status: None (Preliminary result)   Collection Time: 03/15/15 10:20 PM  Result Value Ref Range Status   Specimen Description BLOOD RIGHT ANTECUBITAL  Final   Special Requests BOTTLES DRAWN AEROBIC AND ANAEROBIC 5CC  Final   Culture NO GROWTH 2 DAYS  Final   Report Status PENDING  Incomplete  Culture, blood (routine x 2)     Status: None (Preliminary result)   Collection Time: 03/16/15 12:24 AM  Result Value Ref Range Status   Specimen Description BLOOD RIGHT ARM  Final   Special Requests BOTTLES DRAWN AEROBIC ONLY 10CC POF ZITHROMAX  Final   Culture NO GROWTH 1 DAY  Final   Report Status PENDING  Incomplete   Studies/Results: Dg Chest 2 View  03/16/2015  CLINICAL DATA:  Community acquired pneumonia EXAM: CHEST  2 VIEW COMPARISON:  None. FINDINGS: Upper normal heart size. Vascular congestion. Bilateral patchy airspace disease in a pulmonary edema pattern. No pneumothorax. IMPRESSION: Vascular congestion and bilateral airspace disease in a pulmonary edema pattern. The heart is upper normal in size. Electronically Signed   By: Jolaine Click M.D.   On:  03/16/2015 09:07   Medications: I have reviewed the patient's current medications. Scheduled Meds: . amLODipine  10 mg Oral Daily  . atorvastatin  40 mg Oral q1800  . azithromycin  500 mg Intravenous Q24H  . cefTRIAXone (ROCEPHIN)  IV  1 g Intravenous Q24H  . cholecalciferol  1,000 Units Oral Daily  . ezetimibe  10 mg Oral Daily  . finasteride  5 mg Oral Daily  . insulin aspart  10 Units Subcutaneous TID WC  . insulin glargine  45 Units Subcutaneous Daily  . ipratropium-albuterol  3 mL Nebulization Q6H  . isosorbide mononitrate  60 mg Oral Daily  . metoprolol succinate  150 mg Oral Daily  . montelukast  10 mg Oral Daily  . pantoprazole  40 mg Oral Daily  . sodium chloride  3 mL Intravenous Q12H  . sodium chloride  3 mL Intravenous Q12H  . tamsulosin  0.4 mg Oral Daily  . warfarin  1 mg Oral q1800  . Warfarin - Pharmacist Dosing Inpatient   Does not apply q1800   Continuous Infusions:   PRN Meds:. Assessment/Plan: Active Problems:   Diaphragmatic paralysis   CAP (community acquired pneumonia)   Diabetes mellitus (HCC)   HTN (hypertension)   Atrial fibrillation (HCC)   Bilateral lower extremity edema   Chronic kidney disease (CKD) stage 3   Bilateral pleural effusion   Community acquired pneumonia   Hypoxia  Terrence [name] is an 81 year old man with a history of right hemidiaphragm paralysis, hypoxemia, chronic atrial fibrillation, hypertension, hyperlipidemia, stage III chronic kidney disease, and type 2 diabetes who presents with acute on chronic hypoxic respiratory failure.  Acute on chronic hypoxic respiratory failure  Patient with acutely worsening dyspnea over the past week with mental status changes and severe oxygen desaturation. Imaging showing ground-glass opacities and small pleural effusions. Also showing multi-station lymphadenopathy in the mediastinum and bilateral hilar lymphadenopathy. Echo done yesterday showing normal systolic function, mild LVH, mild aortic  stenosis, mild mitral regurgitation, and moderate LA dilation. His hypoxia is likely secondary to an infectious process such as viral or atypical pneumonia. Cor pulmonale might also be playing a role as indicated by chronic venous insufficiency and stasis dermatitis, however, echo did not report PA pressure. Patient reports not using his home oxygen regularly which could have lead to chronic hypoxemia. In addition, he has hypoxic respiratory failure due to shunt from R hemidiaphragm paralysis and R base consolidation. Acute worsening of patient's symptoms is likely related to atypical CAP. At this point we are unable to do a CTA to assess his pulmonary vasculature due to his kidney disease; unable to do a V/Q scan  due to his abnormal baseline CXR; unable to do PFTs due to his acute respiratory decompensation. Patient has been diuresed with IV Lasix. He still has mild crackles (diffuse) on lung exam but does not appear significantly fluid overloaded otherwise. Trace pitting edema in bilateral lower extremities could be due to venous insufficiency. Blood culture showing no growth in 2 days. Urine legionella and strep pneumo negative. Patient has been deescalated to 4L O2 via [place] and is satting in the lower 90s.   -Discontinue IV Lasix  -Continue home dose Lasix 20 mg BID -cont Amlodipine 10 mg daily to reduced afterload in case patient is found to have ventricular dysfunction from his pulmonary HTN -Continue IV antibiotics ceftriaxone and azithromycin to treat his CAP -F/u final blood culture results  -F/u sputum culture  -Continue O2 target SpO2 > 85% -Continuous pulse oximetry -Continue home montelukast 10 mg by mouth -Duoneb q6 prn  Essential Hypertension: Blood pressure 140/75 today.  -cont Amlodipine 10 mg daily  -Lasix 20 mg PO BID -Imdur 60 mg PO -Toprol-XL 150 mg PO  Type 2 diabetes mellitus: Patient has high insulin requirements at home using 66 units Lantus at night, and insulin NovoLog 12  units with breakfast and 16 units with lunch and 42 units at dinnertime. As per nursing staff, patient did not eat lunch yesterday and his late afternoon CBG was in the 70s. CBG in the 100s today.  -Lantus 45U qhs -Discontinue Insulin aspart 10U TIDWM -SSI-moderate  -CBGs TIDAC and qhs  Chronic atrial fibrillation: Patient on long-term Coumadin anticoagulation with target INR of 2.0-3.0 for atrial fibrillation. -PT/INR -Continue Coumadin, pharmacy consulted for dosing  Normocytic anemia: Hgb 9.2 today with MCV 92.4. It was 12.1 in 11/2014 and 11.0 on admission. UA on admission showing small amount of Hgb but normal range RBCs 0-5. I spoke to the patient and he reports being chronically anemic. States he does not take an iron supplement at home. Patient denies having any hematemesis, hematochezia, melena, or hematuria. He is on long term anticoagulation with Coumadin for A-fib.   -FOBT ordered to check for possible GI cause for blood loss  -F/u anemia panel   CKD stage 3: Stable. SCr 1.9 today with baseline 1.7-1.8.   BPH: Continue home tamsulosin 0.4 mg, finasteride 5 mg  GERD: Continue home pantoprazole 40 mg PO  Hyperlipidemia: Continue home atorvastatin 40mg , ezetimibe 10mg   Diet: Carb modified  DVT ppx: Coumadin  Code: Full   Dispo: Disposition is deferred at this time, awaiting improvement of current medical problems.  Anticipated discharge in approximately 2-3 day(s).   The patient does have a current PCP (Kip A Corrington, MD) and [facility].  The patient does not have transportation limitations that hinder transportation to clinic appointments.  .Services Needed at time of discharge: Y = Yes, Blank = No PT:   OT:   RN:   Equipment:   Other:     LOS: 2 days   Terrence GiovanniVasundhra Abcde Oneil, MD 03/18/2015, 8:37 AM

## 2015-03-18 NOTE — Progress Notes (Signed)
ANTICOAGULATION CONSULT NOTE - Follow Up Consult  Pharmacy Consult for warfarin Indication: atrial fibrillation  No Known Allergies  Patient Measurements: Height: 6\' 3"  (190.5 cm) Weight: 256 lb 6.3 oz (116.3 kg) IBW/kg (Calculated) : 84.5  Vital Signs: Temp: 98.4 F (36.9 C) (12/20 0821) Temp Source: Oral (12/20 0821) BP: 140/75 mmHg (12/20 0821) Pulse Rate: 51 (12/20 0821)  Labs:  Recent Labs  03/15/15 1627 03/16/15 0502 03/17/15 0416 03/18/15 0645  HGB 11.0*  --  9.8* 9.2*  HCT 36.4*  --  33.3* 30.3*  PLT 185  --  184 162  LABPROT 29.4* 26.9* 29.6* 28.0*  INR 2.85* 2.53* 2.87* 2.66*  CREATININE 1.90*  --  1.86* 1.97*    Estimated Creatinine Clearance: 37 mL/min (by C-G formula based on Cr of 1.97).   Medications:  Scheduled:  . amLODipine  10 mg Oral Daily  . atorvastatin  40 mg Oral q1800  . azithromycin  500 mg Intravenous Q24H  . cefTRIAXone (ROCEPHIN)  IV  1 g Intravenous Q24H  . cholecalciferol  1,000 Units Oral Daily  . ezetimibe  10 mg Oral Daily  . finasteride  5 mg Oral Daily  . insulin aspart  10 Units Subcutaneous TID WC  . insulin glargine  45 Units Subcutaneous Daily  . ipratropium-albuterol  3 mL Nebulization Q6H  . isosorbide mononitrate  60 mg Oral Daily  . metoprolol succinate  150 mg Oral Daily  . montelukast  10 mg Oral Daily  . pantoprazole  40 mg Oral Daily  . sodium chloride  3 mL Intravenous Q12H  . sodium chloride  3 mL Intravenous Q12H  . tamsulosin  0.4 mg Oral Daily  . warfarin  1 mg Oral q1800  . Warfarin - Pharmacist Dosing Inpatient   Does not apply q1800    Assessment: 81yo M admitted 03/15/2015 with CAP. On warfarin PTA for Afib, pharmacy consulted to continue warfarin.   INR 2.66, Hgb 11>9.8>9.2, Plt wnl, No s/sx of bleeding noted.  PTA warfarin dose: 1 mg daily  Goal of Therapy:  INR 2-3 Monitor platelets by anticoagulation protocol: Yes   Plan:  - Continue warfarin 1 mg PO daily - Monitor daily INR, CBC  and s/sx of bleeding  Casilda Carlsaylor Katarzyna Wolven, PharmD. PGY-1 Pharmacy Resident Pager: [phone] 03/18/2015,8:49 AM

## 2015-03-18 NOTE — Evaluation (Signed)
Occupational Therapy Evaluation Patient Details Name: Ollen BowlJohn J Portal MRN: [id number] DOB: September 01, 1926 Today's Date: 03/18/2015    History of Present Illness Mr. 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    Clinical Impression   Patient evaluated by Occupational Therapy with no further acute OT needs identified. All education has been completed and the patient has no further questions. See below for any follow-up Occupational Therapy or equipment needs. OT to sign off. Thank you for referral.   Pt educated to be OOB with RN staff multiple times a day and to sit in chair for all meals. Pt with oxygen desaturation to 87% on 4 L with mobility after delayed reading. Pt remains with oxygen needs during BADLs.     Follow Up Recommendations  No OT follow up    Equipment Recommendations  None recommended by OT    Recommendations for Other Services       Precautions / Restrictions Precautions Precautions: Fall Precaution Comments: decr saturation with mobility      Mobility Bed Mobility               General bed mobility comments: in chair on arrival  Transfers Overall transfer level: Needs assistance Equipment used: Rolling walker (2 wheeled) Transfers: Sit to/from Stand Sit to Stand: Supervision         General transfer comment: cues for safety wtih RW . Pt educated on backing up with RW to chair and reaching for chair prior to sitting. pt demonstrated uncontrolled descend and abandoned RW    Balance Overall balance assessment: Needs assistance            Standing balance-Leahy Scale: Fair Standing balance comment: able to static stand and complete oral care withut BIL UE (A)                            ADL Overall ADL's : At baseline                                       General ADL Comments: pt standing at sink for oral care and hand hygiene. Pt completed toilet transfer and line management. pt educated on shower transfer and wife (A). pt educated on water temperature with current 4 L requirements. Pt agreeable     Vision     Perception     Praxis      Pertinent Vitals/Pain Pain Assessment: No/denies pain     Hand Dominance Right   Extremity/Trunk Assessment Upper Extremity Assessment Upper Extremity Assessment: Overall WFL for tasks assessed   Lower Extremity Assessment Lower Extremity Assessment: Defer to PT evaluation   Cervical / Trunk Assessment Cervical / Trunk Assessment: Normal   Communication Communication Communication: No difficulties   Cognition Arousal/Alertness: Awake/alert Behavior During Therapy: WFL for tasks assessed/performed Overall Cognitive Status: Within Functional Limits for tasks assessed                     General Comments       Exercises  Shoulder Instructions      Home Living Family/patient expects to be discharged to:: Private residence Living Arrangements: Spouse/significant other Available Help at Discharge: Family;Available 24 hours/day Type of Home:  (condo) Home Access: Stairs to enter [employer] of Steps: 5 Entrance Stairs-Rails: Right Home Layout: One level     Bathroom Shower/Tub: Producer, television/film/video: Handicapped height     Home Equipment: Cane - quad;Shower seat - built in;Grab bars - tub/shower;Grab bars - toilet (grab bars on the outside)          Prior Functioning/Environment Level of Independence: Independent        Comments: uses cane intermittently, due to DOE pt amulating short  distances PTA    OT Diagnosis:     OT Problem List:     OT Treatment/Interventions:      OT Goals(Current goals can be found in the care plan section) Acute Rehab OT Goals Patient Stated Goal: to go home and help kids with homework OT Goal Formulation: With patient Potential to Achieve Goals: Good  OT Frequency:     Barriers to D/C:            Co-evaluation              End of Session Equipment Utilized During Treatment: Rolling walker;Oxygen Nurse Communication: Mobility status;Precautions  Activity Tolerance: Patient tolerated treatment well Patient left: in chair;with call bell/phone within reach;with chair alarm set   Time: 1300-1319 OT Time Calculation (min): 19 min Charges:  OT General Charges $OT Visit: 1 Procedure OT Evaluation $Initial OT Evaluation Tier I: 1 Procedure G-Codes:    Harolyn Rutherford 03/26/2015, 1:28 PM   Mateo Flow   OTR/L Pager: [phone] Office: [phone] .

## 2015-03-18 NOTE — Progress Notes (Signed)
Patient ID: Terrence BowlJohn J [name], male   DOB: 1926-04-23, 81 y.o.   MRN: [id number] Medicine attending: I examined this patient today together with resident physician Dr. Srihari GiovanniVasundhra [name] and I concur with her evaluation and management plan which we discussed together. The patient remains clinically stable at rest on oxygen. He has minimal pulmonary reserve and desaturates rapidly with any activity causing alarm to the staff. I'm comfortable keeping his oxygen saturation at 85% or above but not at the expense of over suppressing his respiratory drive with excessively high inspired oxygen concentrations. He continues on parenteral antibiotics for multifocal bilateral pneumonia. He remains afebrile. White count has normalized. As noted above in Dr. Gardiner Rhymeathore's note, echocardiogram done December 19 shows normal left ventricular function with no wall motion abnormalities. If he remains stable, we will transition him to oral antibiotics and consider discharge within the next 24-48 hours.

## 2015-03-19 DIAGNOSIS — J449 Chronic obstructive pulmonary disease, unspecified: Secondary | ICD-10-CM | POA: Insufficient documentation

## 2015-03-19 DIAGNOSIS — J438 Other emphysema: Secondary | ICD-10-CM

## 2015-03-19 LAB — GLUCOSE, CAPILLARY
GLUCOSE-CAPILLARY: 122 mg/dL — AB (ref 65–99)
GLUCOSE-CAPILLARY: 208 mg/dL — AB (ref 65–99)
GLUCOSE-CAPILLARY: 131 mg/dL — AB (ref 65–99)
GLUCOSE-CAPILLARY: 179 mg/dL — AB (ref 65–99)

## 2015-03-19 LAB — IRON AND TIBC
IRON: 40 ug/dL — AB (ref 45–182)
SATURATION RATIOS: 11 % — AB (ref 17.9–39.5)
TIBC: 364 ug/dL (ref 250–450)
UIBC: 324 ug/dL

## 2015-03-19 LAB — RETICULOCYTES
RBC.: 3.48 MIL/uL — ABNORMAL LOW (ref 4.22–5.81)
RETIC COUNT ABSOLUTE: 107.9 10*3/uL (ref 19.0–186.0)
Retic Ct Pct: 3.1 % (ref 0.4–3.1)

## 2015-03-19 LAB — VITAMIN B12: Vitamin B-12: 402 pg/mL (ref 180–914)

## 2015-03-19 LAB — FOLATE: FOLATE: 19.3 ng/mL (ref >5.9)

## 2015-03-19 LAB — FERRITIN: Ferritin: 40 ng/mL (ref 24–336)

## 2015-03-19 LAB — PROTIME-INR
INR: 2.29 — ABNORMAL HIGH (ref 0.00–1.49)
Prothrombin Time: 25 seconds — ABNORMAL HIGH (ref 11.6–15.2)

## 2015-03-19 MED ORDER — GUAIFENESIN ER 600 MG PO TB12
600.0000 mg | ORAL_TABLET | Freq: Two times a day (BID) | ORAL | Status: DC
  Administered 2015-03-19 – 2015-03-20 (×3): 600 mg via ORAL
  Filled 2015-03-19 (×3): qty 1

## 2015-03-19 MED ORDER — AZITHROMYCIN 500 MG PO TABS
500.0000 mg | ORAL_TABLET | Freq: Every day | ORAL | Status: DC
  Administered 2015-03-19 – 2015-03-20 (×2): 500 mg via ORAL
  Filled 2015-03-19 (×2): qty 1

## 2015-03-19 MED ORDER — AMOXICILLIN 500 MG PO CAPS
500.0000 mg | ORAL_CAPSULE | Freq: Two times a day (BID) | ORAL | Status: DC
  Administered 2015-03-19 – 2015-03-20 (×2): 500 mg via ORAL
  Filled 2015-03-19 (×3): qty 1

## 2015-03-19 MED ORDER — SENNOSIDES-DOCUSATE SODIUM 8.6-50 MG PO TABS: 1.0000 | ORAL_TABLET | Freq: Every evening | ORAL | Status: DC | PRN

## 2015-03-19 MED ORDER — SENNOSIDES-DOCUSATE SODIUM 8.6-50 MG PO TABS
1.0000 | ORAL_TABLET | Freq: Once | ORAL | Status: AC
  Administered 2015-03-19: 1 via ORAL
  Filled 2015-03-19: qty 1

## 2015-03-19 MED ORDER — AMOXICILLIN 500 MG PO CAPS
1000.0000 mg | ORAL_CAPSULE | Freq: Three times a day (TID) | ORAL | Status: DC
  Administered 2015-03-19: 1000 mg via ORAL
  Filled 2015-03-19 (×3): qty 2

## 2015-03-19 MED ORDER — FLUTICASONE PROPIONATE 50 MCG/ACT NA SUSP
2.0000 | Freq: Every day | NASAL | Status: DC
  Administered 2015-03-20: 2 via NASAL
  Filled 2015-03-19: qty 16

## 2015-03-19 NOTE — Progress Notes (Signed)
Subjective: Patient was seen and examined at bedside this morning. Nursing staff reported 5 runs of vtach yesterday. Patient was eating his breakfast this morning and reported feeling well. He denies having any SOB, cough, CP, fevers or chills. No other complaints. Patient has been deescalated to 4L O2 via [place] and is satting in the lower 90s.  Overnight events: Patient was confused last night when his O2 sat dropped. He called his daughter at 2am and said he didn't know where he was. Daughter states patient is better this morning and does remember making phone calls to the family. When we saw the patient this morning he was AAOx3 and his attention was good.   Objective: Vital signs in last 24 hours: Filed Vitals:   03/19/15 0406 03/19/15 0610 03/19/15 0844 03/19/15 0855  BP: 130/58   138/70  Pulse: 65   64  Temp: 97.5 F (36.4 C)   97.5 F (36.4 C)  TempSrc: Oral   Oral  Resp: 17   17  Height:      Weight:      SpO2: 96% 96% 98% 98%   Weight change:   Intake/Output Summary (Last 24 hours) at 03/19/15 1116 Last data filed at 03/19/15 0945  Gross per 24 hour  Intake    770 ml  Output    426 ml  Net    344 ml   Physical Exam  Constitutional: He is oriented to person, place, and time. He appears well-developed and well-nourished.  OOB sitting in chair eating breakfast. No acute distress.   Cardiovascular: Normal rate and intact distal pulses.   Irregularly irregular rhythm   Pulmonary/Chest:  Decreased breath sounds over right lung base Mild crackles heard diffusely.  No clubbing or cyanosis noted.   Abdominal: Soft. Bowel sounds are normal. He exhibits no distension. There is no tenderness.  Musculoskeletal:  Trace pitting edema of bilateral lower extremities   Neurological: He is alert and oriented to person, place, and time.  Serial seven test normal (attention good)   Skin: Skin is warm and dry.  Chronic venous stasis changes noted on bilateral lower extremities    Lab  Results: Basic Metabolic Panel:  Recent Labs Lab 03/17/15 0416 03/18/15 0645  NA 138 137  K 4.5 4.4  CL 103 101  CO2 28 23  GLUCOSE 158* 123*  BUN 39* 45*  CREATININE 1.86* 1.97*  CALCIUM 8.7* 8.9  PHOS  --  4.4   Liver Function Tests:  Recent Labs Lab 03/15/15 1627 03/18/15 0645  AST 19  --   ALT 18  --   ALKPHOS 60  --   BILITOT 1.9*  --   PROT 6.6  --   ALBUMIN 3.4* 2.8*   CBC:  Recent Labs Lab 03/15/15 1627 03/17/15 0416 03/18/15 0645  WBC 13.6* 12.5* 9.5  NEUTROABS 12.2*  --   --   HGB 11.0* 9.8* 9.2*  HCT 36.4* 33.3* 30.3*  MCV 90.8 92.8 92.4  PLT 185 184 162   CBG:  Recent Labs Lab 03/17/15 1729 03/18/15 0730 03/18/15 1104 03/18/15 1555 03/18/15 2116 03/19/15 0739  GLUCAP 76 168* 304* 187* 79 122*   Coagulation:  Recent Labs Lab 03/16/15 0502 03/17/15 0416 03/18/15 0645 03/19/15 0915  LABPROT 26.9* 29.6* 28.0* 25.0*  INR 2.53* 2.87* 2.66* 2.29*   Urinalysis:  Recent Labs Lab 03/15/15 1649  COLORURINE YELLOW  LABSPEC 1.020  PHURINE 5.5  GLUCOSEU 250*  HGBUR SMALL*  BILIRUBINUR NEGATIVE  KETONESUR NEGATIVE  PROTEINUR 100*  NITRITE NEGATIVE  LEUKOCYTESUR NEGATIVE   Micro Results: Recent Results (from the past 240 hour(s))  Culture, blood (routine x 2)     Status: None (Preliminary result)   Collection Time: 03/15/15 10:20 PM  Result Value Ref Range Status   Specimen Description BLOOD RIGHT ANTECUBITAL  Final   Special Requests BOTTLES DRAWN AEROBIC AND ANAEROBIC 5CC  Final   Culture NO GROWTH 3 DAYS  Final   Report Status PENDING  Incomplete  Culture, blood (routine x 2)     Status: None (Preliminary result)   Collection Time: 03/16/15 12:24 AM  Result Value Ref Range Status   Specimen Description BLOOD RIGHT ARM  Final   Special Requests BOTTLES DRAWN AEROBIC ONLY 10CC POF ZITHROMAX  Final   Culture NO GROWTH 2 DAYS  Final   Report Status PENDING  Incomplete  Culture, expectorated sputum-assessment     Status:  None   Collection Time: 03/18/15  9:48 AM  Result Value Ref Range Status   Specimen Description SPUTUM  Final   Special Requests NONE  Final   Sputum evaluation   Final    MICROSCOPIC FINDINGS SUGGEST THAT THIS SPECIMEN IS NOT REPRESENTATIVE OF LOWER RESPIRATORY SECRETIONS. PLEASE RECOLLECT. RESULT CALLED TO, READ BACK BY AND VERIFIED WITH: Narambas RN 13:15 03/18/15 ([email or web address])    Report Status 03/18/2015 FINAL  Final   Studies/Results: No results found. Medications: I have reviewed the patient's current medications. Scheduled Meds: . amLODipine  10 mg Oral Daily  . amoxicillin  1,000 mg Oral 3 times per day  . atorvastatin  40 mg Oral q1800  . azithromycin  500 mg Oral Daily  . cholecalciferol  1,000 Units Oral Daily  . ezetimibe  10 mg Oral Daily  . finasteride  5 mg Oral Daily  . fluticasone  2 spray Each Nare Daily  . furosemide  20 mg Oral BID  . guaiFENesin  600 mg Oral BID  . insulin aspart  0-15 Units Subcutaneous TID WC  . insulin aspart  0-5 Units Subcutaneous QHS  . insulin glargine  45 Units Subcutaneous Daily  . ipratropium-albuterol  3 mL Nebulization Q6H  . isosorbide mononitrate  60 mg Oral Daily  . metoprolol succinate  150 mg Oral Daily  . montelukast  10 mg Oral Daily  . pantoprazole  40 mg Oral Daily  . senna-docusate  1 tablet Oral Once  . sodium chloride  3 mL Intravenous Q12H  . sodium chloride  3 mL Intravenous Q12H  . tamsulosin  0.4 mg Oral Daily  . warfarin  1 mg Oral q1800  . Warfarin - Pharmacist Dosing Inpatient   Does not apply q1800   Continuous Infusions:   PRN Meds:. Assessment/Plan: Active Problems:   Diaphragmatic paralysis   CAP (community acquired pneumonia)   Diabetes mellitus (HCC)   HTN (hypertension)   Atrial fibrillation (HCC)   Bilateral lower extremity edema   Chronic kidney disease (CKD) stage 3   Bilateral pleural effusion   Community acquired pneumonia   Hypoxia   Normocytic anemia  Mr. Terrence [name] is an  81 year old man with a history of right hemidiaphragm paralysis, hypoxemia, chronic atrial fibrillation, hypertension, hyperlipidemia, stage III chronic kidney disease, and type 2 diabetes who presents with acute on chronic hypoxic respiratory failure.  Acute on chronic hypoxic respiratory failure  Patient with acutely worsening dyspnea over the past week with mental status changes and severe oxygen desaturation. Imaging showing ground-glass opacities and small pleural effusions. Also showing multi-station  lymphadenopathy in the mediastinum and bilateral hilar lymphadenopathy. Echo showing normal systolic function, mild LVH, mild aortic stenosis, mild mitral regurgitation, and moderate LA dilation. His hypoxia is likely secondary to an infectious process such as viral or atypical pneumonia. Cor pulmonale might also be playing a role as indicated by chronic venous insufficiency and stasis dermatitis, however, echo did not report PA pressure. Patient reports not using his home oxygen regularly which could have lead to chronic hypoxemia. In addition, he has hypoxic respiratory failure due to shunt from R hemidiaphragm paralysis and R base consolidation. Acute worsening of patient's symptoms is likely related to an atypical CAP. He still has mild crackles (diffuse) on lung exam but does not appear significantly fluid overloaded otherwise. Trace pitting edema in bilateral lower extremities could be due to venous insufficiency. Blood culture showing no growth in 3 days. Sputum culture was a poor sample and needs to be recollected. Patient has been deescalated to 4L O2 via [place] and is satting in the lower 90s. -Continue home dose Lasix 20 mg BID -cont Amlodipine 10 mg daily to reduced afterload -Discontinue IV antibiotics ceftriaxone and azithromycin  -Started on PO Amoxicillin 1000 mg q8 x 5 days -Started on PO Azithromycin 500 mg qd x 5 days -F/u final blood culture results  -F/u sputum culture  -Continue O2  target SpO2 > 85% -Continuous pulse oximetry -Continue home montelukast 10 mg by mouth -Duoneb q6 prn  Essential Hypertension: Blood pressure 140/75 today.  -cont Amlodipine 10 mg daily  -Lasix 20 mg PO BID -Imdur 60 mg PO -Toprol-XL 150 mg PO  Type 2 diabetes mellitus: Patient has high insulin requirements at home using 66 units Lantus at night, and insulin NovoLog 12 units with breakfast and 16 units with lunch and 42 units at dinnertime. CBG in the 122 this morning.  -Lantus 45U qhs -SSI-moderate  -CBGs TIDAC and qhs  Chronic atrial fibrillation: Patient on long-term Coumadin anticoagulation with target INR of 2.0-3.0 for atrial fibrillation. INR 2.29 today.  -PT/INR -Continue Coumadin, pharmacy consulted for dosing  Normocytic anemia: Hgb 9.2 today with MCV 92.4. It was 12.1 in 11/2014 and 11.0 on admission. UA on admission showing small amount of Hgb but normal range RBCs 0-5. I spoke to the patient and he reports being chronically anemic. States he does not take an iron supplement at home. Patient denies having any hematemesis, hematochezia, melena, or hematuria. He is on long term anticoagulation with Coumadin for A-fib. Anemia panel showing Ferritin 40 but iron low (40) and saturation ratio low (11).  -FOBT ordered to check for possible GI cause for blood loss. Still pending.    CKD stage 3: Stable. SCr 1.9 today with baseline 1.7-1.8.   BPH: Continue home tamsulosin 0.4 mg, finasteride 5 mg  GERD: Continue home pantoprazole 40 mg PO  Hyperlipidemia: Continue home atorvastatin , ezetimibe   Diet: Carb modified  DVT ppx: Coumadin  Code: Full   Dispo: Disposition is deferred at this time, awaiting improvement of current medical problems.  Anticipated discharge in approximately 2-3 day(s).   The patient does have a current PCP (Kip A Corrington, MD) and does need an [facility] hospital follow-up appointment after discharge.  The patient does not have transportation  limitations that hinder transportation to clinic appointments.  .Services Needed at time of discharge: Y = Yes, Blank = No PT:   OT:   RN:   Equipment:   Other:     LOS: 3 days   Migel Giovanni, MD  03/19/2015, 11:16 AM

## 2015-03-19 NOTE — Progress Notes (Signed)
Patient ID: Terrence BowlJohn J Lacek, male   DOB: 1927/02/12, 81 y.o.   MRN: [id number] Medicine attending: I personally examined this patient this morning together with resident physician Dr. Fern GiovanniVasundhra Rathore and I concur with her evaluation and management plan which we discussed together. The patient continues to improve. There are still issues with oxygen desaturation with minimal activity. One episode of intermittent confusion during the night. He is quite lucid this morning. He was able to do serial subtractions of 7 from 100 without any hesitation. No focal neurologic deficits. Lungs now overall clear with minimal basilar rales. We have been able to decrease his oxygen to 4 L nasal cannula which is adequate to maintain oxygenation at rest. We will transition him to oral antibiotics today and if otherwise stable, discharge tomorrow. His wife was present and management plan was discussed.

## 2015-03-19 NOTE — Progress Notes (Signed)
Pt loss iv access, discussed with MD, since pt is being switched to PO antibiotics, no IV is required, and may be left out per Dr. Senaida Oresichardson.    Terrence [name] A 03/19/2015 10:06 AM

## 2015-03-19 NOTE — Progress Notes (Signed)
ANTICOAGULATION CONSULT NOTE - Follow Up Consult  Pharmacy Consult for warfarin Indication: atrial fibrillation  No Known Allergies  Patient Measurements: Height: 6\' 3"  (190.5 cm) Weight: 254 lb 10.1 oz (115.5 kg) IBW/kg (Calculated) : 84.5  Vital Signs: Temp: 97.5 F (36.4 C) (12/21 0855) Temp Source: Oral (12/21 0855) BP: 138/70 mmHg (12/21 0855) Pulse Rate: 64 (12/21 0855)  Labs:  Recent Labs  03/17/15 0416 03/18/15 0645 03/19/15 0915  HGB 9.8* 9.2*  --   HCT 33.3* 30.3*  --   PLT 184 162  --   LABPROT 29.6* 28.0* 25.0*  INR 2.87* 2.66* 2.29*  CREATININE 1.86* 1.97*  --     Estimated Creatinine Clearance: 36.9 mL/min (by C-G formula based on Cr of 1.97).   Medications:  Scheduled:  . amLODipine  10 mg Oral Daily  . amoxicillin  1,000 mg Oral 3 times per day  . atorvastatin  40 mg Oral q1800  . azithromycin  500 mg Oral Daily  . cholecalciferol  1,000 Units Oral Daily  . ezetimibe  10 mg Oral Daily  . finasteride  5 mg Oral Daily  . fluticasone  2 spray Each Nare Daily  . furosemide  20 mg Oral BID  . guaiFENesin  600 mg Oral BID  . insulin aspart  0-15 Units Subcutaneous TID WC  . insulin aspart  0-5 Units Subcutaneous QHS  . insulin glargine  45 Units Subcutaneous Daily  . ipratropium-albuterol  3 mL Nebulization Q6H  . isosorbide mononitrate  60 mg Oral Daily  . metoprolol succinate  150 mg Oral Daily  . montelukast  10 mg Oral Daily  . pantoprazole  40 mg Oral Daily  . sodium chloride  3 mL Intravenous Q12H  . sodium chloride  3 mL Intravenous Q12H  . tamsulosin  0.4 mg Oral Daily  . warfarin  1 mg Oral q1800  . Warfarin - Pharmacist Dosing Inpatient   Does not apply q1800    Assessment: 81yo M admitted 03/15/2015 with CAP. On warfarin PTA for Afib, pharmacy consulted to continue warfarin.  Noted abx changed to Amox + Azithro PO.  Will continue to monitor INR frequently given drug interaction.  INR 2.29, Hgb 11>9.8>9.2, Plt wnl, No s/sx of  bleeding noted.  PTA warfarin dose: 1 mg daily  Goal of Therapy:  INR 2-3 Monitor platelets by anticoagulation protocol: Yes   Plan:  - Continue warfarin 1 mg PO daily - Monitor daily INR, CBC and s/sx of bleeding  Toys 'R' UsKimberly Minie Roadcap, Pharm.D., BCPS Clinical Pharmacist Pager [phone] 03/19/2015 1:14 PM

## 2015-03-19 NOTE — Care Management Note (Signed)
Case Management Note  Patient Details  Name: Terrence [name] MRN: [id number] Date of Birth: Jan 19, 1927  Subjective/Objective:      CM following for progression and d/c planning.              Action/Plan: 03/19/2015 Met with pt wife for d/c planning, as pt is easily confused when attempting to plan. Pt currently on home oxygen at night only, will need continuously at d/c, [facility] notified and have requested new oxygen orders. Will ask attending MD to order oxygen. Pt has walker in room . Will add HHRN to [facility] orders as well as HHPT.   Expected Discharge Date:  03/19/15               Expected Discharge Plan:  [place]  In-House Referral:  NA  Discharge planning Services  CM Consult  Post Acute Care Choice:  Durable Medical Equipment Choice offered to:  Spouse  DME Arranged:  Walker rolling, Oxygen DME Agency:  [place] Arranged:  RN, PT [facility] Agency:  [place]  Status of Service:  Completed, signed off  Medicare Important Message Given:    Date Medicare IM Given:    Medicare IM give by:    Date Additional Medicare IM Given:    Additional Medicare Important Message give by:     If discussed at [place] of Stay Meetings, dates discussed:    Additional Comments:  Adron Bene, RN 03/19/2015, 11:19 AM

## 2015-03-19 NOTE — Progress Notes (Signed)
SATURATION QUALIFICATIONS: (This note is used to comply with regulatory documentation for home oxygen)  Patient Saturations on Room Air at Rest = 83%  Please briefly explain why patient needs home oxygen: pt desaturated to 83% on 3L at rest

## 2015-03-19 NOTE — Care Management Important Message (Signed)
Important Message  Patient Details  Name: Ollen BowlJohn J Kwolek MRN: [id number] Date of Birth: 1927/03/02   Medicare Important Message Given:  Yes    Oralia RudMegan P Henery Betzold 03/19/2015, 3:32 PM

## 2015-03-19 NOTE — Progress Notes (Signed)
Inpatient Diabetes Program Recommendations  AACE/ADA: New Consensus Statement on Inpatient Glycemic Control (2015)  Target Ranges:  Prepandial:   less than 140 mg/dL      Peak postprandial:   less than 180 mg/dL (1-2 hours)      Critically ill patients:  140 - 180 mg/dL    Results for Terrence [name], Terrence [name] (MRN [id number]) as of 03/19/2015 11:55  Ref. Range 03/18/2015 07:30 03/18/2015 11:04 03/18/2015 15:55 03/18/2015 21:16  Glucose-Capillary Latest Ref Range: 65-99 mg/dL 409168 (H) 811304 (H) 914187 (H) 79    Results for Terrence [name], Terrence [name] (MRN [id number]) as of 03/19/2015 11:55  Ref. Range 03/19/2015 07:39 03/19/2015 11:35  Glucose-Capillary Latest Ref Range: 65-99 mg/dL 086122 (H) 578208 (H)    Home DM Meds: Lantus 66 units QHS       Novolog 16 units breakfast/ 12 units lunch/ 42 units dinner  Current Insulin Orders: Lantus 45 units daily      Novolog Moderate SSI (0-15 units) TID AC + HS     -Note Novolog 10 units tidwc stopped yesterday AM due to patient having Hypoglycemic event in the late afternoon on 12/19 after getting 10 units Novolog at 12pm with lunch that day.  -Still having some issues with elevated postprandial glucose levels.    MD- Please consider starting low dose Novolog Meal Coverage back-  Novolog 3 units tid with meals     --Will follow patient during hospitalization--  Ambrose FinlandJeannine Johnston Vedika Dumlao RN, MSN, CDE Diabetes Coordinator Inpatient Glycemic Control Team Team Pager: [phone] (8a-5p)

## 2015-03-20 DIAGNOSIS — J431 Panlobular emphysema: Secondary | ICD-10-CM

## 2015-03-20 LAB — CBC
HEMATOCRIT: 33.4 % — AB (ref 39.0–52.0)
HEMOGLOBIN: 10.4 g/dL — AB (ref 13.0–17.0)
MCH: 28 pg (ref 26.0–34.0)
MCHC: 31.1 g/dL (ref 30.0–36.0)
MCV: 89.8 fL (ref 78.0–100.0)
Platelets: 183 10*3/uL (ref 150–400)
RBC: 3.72 MIL/uL — AB (ref 4.22–5.81)
RDW: 16.9 % — ABNORMAL HIGH (ref 11.5–15.5)
WBC: 9.1 10*3/uL (ref 4.0–10.5)

## 2015-03-20 LAB — CULTURE, BLOOD (ROUTINE X 2): CULTURE: NO GROWTH

## 2015-03-20 LAB — GLUCOSE, CAPILLARY
Glucose-Capillary: 83 mg/dL (ref 65–99)
Glucose-Capillary: 186 mg/dL — ABNORMAL HIGH (ref 65–99)

## 2015-03-20 LAB — PROTIME-INR
INR: 2.23 — ABNORMAL HIGH (ref 0.00–1.49)
PROTHROMBIN TIME: 24.5 s — AB (ref 11.6–15.2)

## 2015-03-20 MED ORDER — GUAIFENESIN ER 600 MG PO TB12: 600.0000 mg | ORAL_TABLET | Freq: Two times a day (BID) | ORAL | Status: AC

## 2015-03-20 MED ORDER — IPRATROPIUM-ALBUTEROL 0.5-2.5 (3) MG/3ML IN SOLN: 3.0000 mL | Freq: Four times a day (QID) | RESPIRATORY_TRACT | Status: AC

## 2015-03-20 MED ORDER — AZITHROMYCIN 500 MG PO TABS: 500.0000 mg | ORAL_TABLET | Freq: Every day | ORAL | Status: AC

## 2015-03-20 MED ORDER — AMOXICILLIN 500 MG PO CAPS: 500.0000 mg | ORAL_CAPSULE | Freq: Two times a day (BID) | ORAL | Status: AC

## 2015-03-20 NOTE — Progress Notes (Signed)
Patient Discharge: Disposition: Patient discharged to home with daughter. Education: Reviewed all his medications, prescriptions, follow-up appointments, and also discharge instructions, given handout on pneumonia and oxygen use at home , understood and acknowledged. IV: Discontinued IV before discharge. Telemetry: Discontinued Tele before discharge, CCMD notified. Transportation: patient transported in w/c with oxygen cylinder and walker.  Checked pulse ox before was on 3L at 89. Belongings: patient took all his belongings with him.

## 2015-03-20 NOTE — Progress Notes (Signed)
ANTICOAGULATION CONSULT NOTE - Follow Up Consult  Pharmacy Consult for warfarin Indication: atrial fibrillation  No Known Allergies  Patient Measurements: Height: 6\' 3"  (190.5 cm) Weight: 251 lb (113.853 kg) IBW/kg (Calculated) : 84.5  Vital Signs: Temp: 97.7 F (36.5 C) (12/22 0500) Temp Source: Oral (12/22 0500) BP: 109/72 mmHg (12/22 0500) Pulse Rate: 103 (12/22 0500)  Labs:  Recent Labs  03/18/15 0645 03/19/15 0915 03/20/15 0548  HGB 9.2*  --  10.4*  HCT 30.3*  --  33.4*  PLT 162  --  183  LABPROT 28.0* 25.0* 24.5*  INR 2.66* 2.29* 2.23*  CREATININE 1.97*  --   --     Estimated Creatinine Clearance: 36.7 mL/min (by C-G formula based on Cr of 1.97).  Assessment:  81yo M admitted 03/15/2015 with CAP. On warfarin PTA for Afib, pharmacy consulted to continue warfarin. Noted abx changed to Amox + Azithro PO. Will continue to monitor INR frequently given drug interaction. PTA warfarin dose 1 mg daily.  INR 2.23, Hgb 10.4, plts 183  Goal of Therapy:  INR 2-3 Monitor platelets by anticoagulation protocol: Yes   Plan:  - Continue warfarin 1 mg PO daily (from PTA dose) - Monitor daily INR, CBC and s/sx of bleeding  Sandi CarneNick Beckham Buxbaum, PharmD Pharmacy Resident Pager: [phone] 03/20/2015,8:40 AM

## 2015-03-20 NOTE — Discharge Instructions (Signed)
Antibiotics:  Take Amoxicillin 500 mg twice daily  Take Azithromycin 500 mg once daily   Use Duoneb nebulizer as instructed.

## 2015-03-20 NOTE — Progress Notes (Signed)
Patient ID: Terrence [name], male   DOB: 08-Aug-1926, 81 y.o.   MRN: [id number] Medicine attending discharge note: I personally examined this patient on the day of discharge together with resident physician Dr. Ashford GiovanniVasundhra Rathore and I attest to the accuracy of her evaluation and discharge plan as recorded in her daily progress note dated 03/20/2015.  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 On arrival at the patient's home , EMS team recorded an oxygen saturation of about 80% on room air. He was initially put on a non rebreather oxygen mask. Portable chest x-ray showed in addition to the elevated right hemidiaphragm, left lower lung atelectasis versus early pneumonia and pulmonary vascular congestion. A CT scan of the chest shows much more dramatic abnormalities including, cardiomegaly, calcified coronary arteries, calcified aortic valve, mediastinal lymphadenopathy, size not stated,impressive multi focal airspace disease involving left upper lobe, left lower lobe,where there are ground glass type infiltrates, and to a lesser degree right upper lobe, middle lobe, and lower lobe. Bilateral pleural effusions.radiologist reading this as being most consistent with multifocal pneumonia with associated reactive adenopathy and parapneumonic effusions.BNP only borderline increased at 401.no comparison value available.no elevation of troponin. Chronic atrial fibrillation on EKG. Right axis deviation. Occasional PVC.  Hospital course: He was started on antibiotics to cover a community-acquired pneumonia. Clinical condition stabilized.  He has been able to transition successfully to nasal cannula oxygen and is maintaining oxygen saturations over 90% He was afebrile on admission and remains so at this time. He remained clinically stable for the entire hospital stay. He looked much better than his x-rays. It was clear that he had little pulmonary reserve. Oxygen saturations dropped with minimal activity. He had one episode of transient confusion which he recognized himself when oxygen saturation fell during the night and he became disoriented and called his family. Other than that, he remained perfectly lucid. We were able to taper his oxygen down to 3 L nasal cannula and maintain oxygen saturations over 85% at rest. He remained afebrile. White blood count fell from 13,600 on admission to 9,100 at discharge. He is on chronic warfarin for atrial fibrillation. INR at discharge 2.23 on Coumadin 1 mg daily. He has chronic renal insufficiency. No major change during this admission admission creatinine 1.9 discharge creatinine 1.97 recorded on December 20. No other acute issues occurred during this admission.  Disposition:  Condition stable at time of discharge He will follow-up with his primary care physician Dr. Salvadore Farberorrington. He is advised to stay on continuous oxygen 3 L nasal cannula. There were no complications.

## 2015-03-20 NOTE — Discharge Summary (Signed)
Name: Terrence [name] MRN: [id number] DOB: 11/24/1926 81 y.o. PCP: Vivien PrestoKip A Corrington, MD  Date of Admission: 03/15/2015  3:16 PM Date of Discharge: 03/23/2015 Attending Physician: No att. providers found  Discharge Diagnosis:  Active Problems:   Diaphragmatic paralysis   CAP (community acquired pneumonia)   Diabetes mellitus (HCC)   HTN (hypertension)   Atrial fibrillation (HCC)   Bilateral lower extremity edema   Chronic kidney disease (CKD) stage 3   Bilateral pleural effusion   Community acquired pneumonia   Hypoxia   Normocytic anemia   Chronic obstructive pulmonary disease (HCC)  Discharge Medications:   Medication List    STOP taking these medications        amLODipine 5 MG tablet  Commonly known as:  NORVASC     clindamycin 75 MG/5ML solution  Commonly known as:  CLEOCIN     enalapril 20 MG tablet  Commonly known as:  VASOTEC     ZETIA 10 MG tablet  Generic drug:  ezetimibe      TAKE these medications        amLODipine-olmesartan 5-20 MG tablet  Commonly known as:  AZOR  Take 1 tablet by mouth daily.     ammonium lactate 12 % lotion  Commonly known as:  LAC-HYDRIN  As directed     amoxicillin 500 MG capsule  Commonly known as:  AMOXIL  Take 1 capsule (500 mg total) by mouth every 12 (twelve) hours.     atorvastatin 40 MG tablet  Commonly known as:  LIPITOR  40 mg.     azithromycin 500 MG tablet  Commonly known as:  ZITHROMAX  Take 1 tablet (500 mg total) by mouth daily.     B-D ULTRAFINE III SHORT PEN 31G X 8 MM Misc  Generic drug:  Insulin Pen Needle  USE THREE TIMES A DAY     Cholecalciferol 10000 UNITS Caps  Take 1 capsule by mouth daily.     finasteride 5 MG tablet  Commonly known as:  PROSCAR  5 mg.     furosemide 20 MG tablet  Commonly known as:  LASIX  Take 20 mg by mouth 2 (two) times daily.     guaiFENesin 600 MG 12 hr tablet  Commonly known as:  MUCINEX  Take 1 tablet (600 mg total) by mouth 2 (two) times daily.       insulin aspart 100 UNIT/ML injection  Commonly known as:  novoLOG  Inject before meals as follows: 16 units before breakfast, 12 at lunch and  42 at supper     insulin glargine 100 UNIT/ML injection  Commonly known as:  LANTUS  Inject 66 Units into the skin at bedtime.     ipratropium-albuterol 0.5-2.5 (3) MG/3ML Soln  Commonly known as:  DUONEB  Take 3 mLs by nebulization every 6 (six) hours.     isosorbide mononitrate 60 MG 24 hr tablet  Commonly known as:  IMDUR  60 mg.     lactobacillus Pack  Take 1 packet (1 g total) by mouth 3 (three) times daily with meals. Sprinkle on meals     montelukast 10 MG tablet  Commonly known as:  SINGULAIR  Take 10 mg by mouth daily.     NITROSTAT 0.4 MG SL tablet  Generic drug:  nitroGLYCERIN  Place 0.4 mg under the tongue as needed. Chest pain     ONETOUCH DELICA LANCETS 33G Misc  1 EACH BY OTHER ROUTE 3 (THREE) TIMES A DAY.  PREVACID 30 MG capsule  Generic drug:  lansoprazole  30 mg.     tamsulosin 0.4 MG Caps capsule  Commonly known as:  FLOMAX  Take 0.4 mg by mouth daily.     TOPROL XL 50 MG 24 hr tablet  Generic drug:  metoprolol succinate  50 mg.     metoprolol succinate 100 MG 24 hr tablet  Commonly known as:  TOPROL-XL  Take 1 tablet by mouth daily. Total of 150 mg a day     VALIUM 5 MG tablet  Generic drug:  diazepam  Take 5 mg by mouth daily.     warfarin 1 MG tablet  Commonly known as:  COUMADIN  As directed        Disposition and follow-up:   [name] was discharged from [facility] in Stable condition.  At the hospital follow up visit please address:  1.    2.  Labs / imaging needed at time of follow-up:   3.  Pending labs/ test needing follow-up:   Follow-up Appointments:     Follow-up Information    Follow up with Vivien Presto, MD. Go on 04/03/2015.   Specialty:  Family Medicine   Why:  Follow up appointment at 1 pm.    Contact information:   [address]  [address] [place] [address] [phone]       Follow up with Linton Rump, MD. Go on 04/07/2015.   Specialty:  Pulmonary Disease   Why:  Follow up appointment at 11 am.    Contact information:   [address] [place] [address] [phone]      Procedures Performed:  Dg Chest 2 View  03/16/2015  CLINICAL DATA:  Community acquired pneumonia EXAM: CHEST  2 VIEW COMPARISON:  None. FINDINGS: Upper normal heart size. Vascular congestion. Bilateral patchy airspace disease in a pulmonary edema pattern. No pneumothorax. IMPRESSION: Vascular congestion and bilateral airspace disease in a pulmonary edema pattern. The heart is upper normal in size. Electronically Signed   By: Jolaine Click M.D.   On: 03/16/2015 09:07   Ct Chest Wo Contrast  03/15/2015  CLINICAL DATA:  81 year old male with a history of shortness of breath. Home oxygen. EXAM: CT CHEST WITHOUT CONTRAST TECHNIQUE: Multidetector CT imaging of the chest was performed following the standard protocol without IV contrast. COMPARISON:  Chest x-ray 03/15/2015, CT chest 04/05/2013 FINDINGS: Chest: Unremarkable appearance the superficial soft tissues. No axillary or supraclavicular adenopathy. Unremarkable appearance of the thoracic inlet. Central airways are clear. No tracheal or proximal endobronchial debris. Unremarkable appearance of the esophagus. Global cardiomegaly. Calcifications of the aortic valve, mitral annulus, left main, left anterior descending, circumflex, and right coronary arteries. No pericardial fluid/ thickening. Compared to the prior CT, there are enlarged lymph nodes throughout the nodal stations of the mediastinum. Lymph nodes also present within the bilateral hilar regions, more pronounced on the left. Unremarkable course caliber and contour of the thoracic aorta with no aneurysm. Calcifications of the aortic arch and descending thoracic aorta. Calcifications of the branch vessels. Multifocal airspace  opacities involving predominantly the left upper lobe and left lower lobe, and to a lesser degree the right upper lobe, right middle lobe, and right lower lobe. Moderate bilateral pleural effusions, including fluid within the left fissure. Mild interlobular septal thickening, more pronounced within the left upper lobe and left lower lobe. Atelectatic changes bilateral lower lobes, more pronounced on the right. Upper abdomen: Unremarkable appearance of liver  and spleen. Cholelithiasis. Calcifications of the abdominal aorta. Musculoskeletal: No displaced fracture.  Degenerative changes of the thoracic spine. IMPRESSION: Left greater than right airspace disease and associated moderate pleural effusions, most concerning for multifocal pneumonia with reactive adenopathy and parapneumonic effusions. Superimposed edema/CHF not excluded, although the pattern of interstitial disease is favored to be infectious/inflammatory. Atherosclerosis with associated left main and 3 vessel coronary artery disease. Signed, Yvone Neu. Loreta Ave, DO [facility] Radiology Electronically Signed   By: Gilmer Mor D.O.   On: 03/15/2015 20:02   Dg Chest Portable 1 View  03/15/2015  CLINICAL DATA:  81 year old male with shortness of breath, hypoxia and cough today. EXAM: PORTABLE CHEST 1 VIEW COMPARISON:  02/02/2013 chest radiograph.  04/05/2013 chest CT FINDINGS: The cardiomediastinal silhouette is unchanged. Elevation of the right hemidiaphragm is again noted. Pulmonary vascular congestion is identified. Airspace disease/ atelectasis in the left lower lung noted. There is no evidence of pneumothorax. IMPRESSION: Left lower lung atelectasis versus airspace disease/pneumonia. Electronically Signed   By: Harmon Pier M.D.   On: 03/15/2015 17:17    2D Echo:  03/17/15 Study Conclusions  - Left ventricle: The cavity size was normal. Wall thickness was increased in a pattern of mild LVH.  Systolic function was normal. Wall motion was normal; there were no regional wall motion abnormalities. - Aortic valve: There was very mild stenosis. Valve area (VTI): 1.83 cm^2. Valve area (Vmax): 1.96 cm^2. Valve area (Vmean): 1.75 cm^2. - Mitral valve: Calcified annulus. Mildly thickened leaflets . There was mild regurgitation. - Left atrium: The atrium was moderately dilated. - Atrial septum: No defect or patent foramen ovale was identified. - Pericardium, extracardiac: A trivial pericardial effusion was identified.  Admission HPI: Patient is an 81 year old man with past medical history of chronic atrial fibrillation, hypertension, type 2 diabetes mellitus, hyperlipidemia, CK D stage III and chronic restrictive lung disease presenting for worsening shortness of breath. He has a chronic intermittently productive cough and dyspnea on exertion that has been worsened since early November. This is associated with sinus congestion and he was seen by pulmonology and given antihistamines, decongestants, and intranasal steroids over the interval. He has been using oxygen 2 L/m by nasal cannula for his dyspnea and hypoxia. However in the past 4-5 days his dyspnea has been severe and he cannot even tolerate walking between several rooms in the house without becoming short of breath. For the past 1 day he has had some altered mental status reporting vomiting which was not witnessed by anyone else in the house and confusion about his current location claiming he was in a [employer] parking lot when outside the house. Last night he did not use his oxygen by nasal cannula and paced throughout the night. He was more confused this morning then became extremely dyspneic with cyanosis getting in the car to travel to the emergency department, and this prompted family to contact EMS. He denies any recent fever but says he very often has chills that is not new. He does not have any chest pain with coughing and no  diarrhea or abdominal pain. He sleeps at home on one pillow with the bed in a flat position. He has mild bilateral leg swelling that is chronic without significant recent change. He has no recent exposure to healthcare settings within the past 3 months except doctors' offices.  Emergency staff reported SPO2 of 80% on room air, improved to 88% on 6 L by nasal cannula then 96% when switched  to nonrebreather mask in transit. After arrival to the emergency department he was stable saturating the low 90s on 4 L by nasal cannula. Chest x-ray was obtained that did not show a clear acute process and noncontrast chest CT was obtained suggesting a multifocal pneumonia with bilateral pleural effusions.  Hospital Course by problem list: Active Problems:   Diaphragmatic paralysis   CAP (community acquired pneumonia)   Diabetes mellitus (HCC)   HTN (hypertension)   Atrial fibrillation (HCC)   Bilateral lower extremity edema   Chronic kidney disease (CKD) stage 3   Bilateral pleural effusion   Community acquired pneumonia   Hypoxia   Normocytic anemia   Chronic obstructive pulmonary disease (HCC)   Acute on chronic hypoxic respiratory failure  Patient presented with progressive dyspnea, confusion, and increase in chronic cough intermittently productive of green sputum. 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 He was admitted on 03/15/15 for acute on chronic hypoxic respiratory failure. Upon arrival, EMS reported O2 saturation of 80% on room air and he was initially put on a non-rebreather mask. White count was 13,600 on admission but he was afebrile. Chest x-ray showed elevated right hemidiaphragm, left lower lung atelectasis versus early pneumonia, and pulmonary vascular congestion. Chest CT showed cardiomegaly, calcified coronary arteries, calcified aortic valve, mediastinal  lymphadenopathy (size not stated), impressive multi focal airspace disease involving left upper lobe, left lower lobe,where there were ground glass type infiltrates, and to a lesser degree right upper lobe, middle lobe, and lower lobe. Also showed bilateral pleural effusions. Radiologist reading this as being most consistent with multifocal pneumonia with associated reactive adenopathy and parapneumonic effusions. Echo showed normal systolic function, mild LVH, mild aortic stenosis, mild mitral regurgitation, and moderate LA dilation. BNP only borderline elevated at 401; no prior comparison value available. No elevation of Troponin. EKG showing chronic atrial fibrillation, right axis deviation, and occasional PVCs. Patient was started on antibiotics to treat community acquired pneumonia. His condition improved and patient was able to transition successfully to oxygen via nasal cannula and maintained oxygen saturations over 90%. Patient remained afebrile during his hospital stay. Oxygen saturations dropped with minimal activity. Patient has one episode of transient confusion when his oxygen saturation dropped at night but otherwise remained completely oriented. He was eventually able to maintain oxygen saturations above 85% with 3L oxygen via nasal cannula. His mild leukocytosis resolved by the day of discharge. He was stable and discharged home with continuous home oxygen at 3L via nasal cannula. In addition, he has been scheduled for a follow-up appointment with his PCP Dr. Salvadore Farber on 04/03/15 and his pulmonologist Dr. Lawerance Cruel on 04/07/15.   Chronic atrial fibrillation: Patient is on long-term Coumadin anticoagulation with target INR of 2.0-3.0 for atrial fibrillation. He was kept on Coumadin during this hospitalization per pharmacy protocol. INR 2.23 on the day of discharge.   Discharge Vitals:   BP 109/72 mmHg  Pulse 103  Temp(Src) 97.7 F (36.5 C) (Oral)  Resp 18  Ht 6\' 3"  (1.905 m)  Wt 113.853 kg (251  lb)  BMI 31.37 kg/m2  SpO2 92%  Discharge Labs:  No results found for this or any previous visit (from the past 24 hour(s)).  Signed: Asif Giovanni, MD 03/23/2015, 8:39 AM    Services Ordered on Discharge:  Equipment Ordered on Discharge:

## 2015-03-20 NOTE — Progress Notes (Signed)
Subjective: Patient was seen and examined at bedside this morning. He was eating his breakfast and reported feeling well. Denies having any SOB, cough, CP, fevers, or chills. No other complaints. Patient is currently satting 92-96% on 3L O2 via [place].   Objective: Vital signs in last 24 hours: Filed Vitals:   03/19/15 2021 03/19/15 2117 03/20/15 0500 03/20/15 0937  BP: 139/55  109/72   Pulse: 56  103   Temp: 97.5 F (36.4 C)  97.7 F (36.5 C)   TempSrc:   Oral   Resp: 20  18   Height:      Weight: 113.853 kg (251 lb)     SpO2: 94% 92% 93% 92%   Weight change: -1.647 kg (-3 lb 10.1 oz)  Intake/Output Summary (Last 24 hours) at 03/20/15 1002 Last data filed at 03/20/15 0902  Gross per 24 hour  Intake    240 ml  Output   1600 ml  Net  -1360 ml   Physical Exam  Constitutional: He is oriented to person, place, and time. He appears well-developed and well-nourished.  OOB sitting in chair eating breakfast. No acute distress.   Cardiovascular: Normal rate and intact distal pulses.   Irregularly irregular rhythm   Pulmonary/Chest:  Decreased breath sounds over right lung base No crackles heard.  No clubbing or cyanosis noted.   Abdominal: Soft. Bowel sounds are normal. He exhibits no distension. There is no tenderness.  Musculoskeletal:  Trace pitting edema of bilateral lower extremities   Neurological: He is alert and oriented to person, place, and time.  Skin: Skin is warm and dry.  Chronic venous stasis changes noted on bilateral lower extremities    Lab Results: Basic Metabolic Panel:  Recent Labs Lab 03/17/15 0416 03/18/15 0645  NA 138 137  K 4.5 4.4  CL 103 101  CO2 28 23  GLUCOSE 158* 123*  BUN 39* 45*  CREATININE 1.86* 1.97*  CALCIUM 8.7* 8.9  PHOS  --  4.4   Liver Function Tests:  Recent Labs Lab 03/15/15 1627 03/18/15 0645  AST 19  --   ALT 18  --   ALKPHOS 60  --   BILITOT 1.9*  --   PROT 6.6  --   ALBUMIN 3.4* 2.8*   CBC:  Recent  Labs Lab 03/15/15 1627  03/18/15 0645 03/20/15 0548  WBC 13.6*  < > 9.5 9.1  NEUTROABS 12.2*  --   --   --   HGB 11.0*  < > 9.2* 10.4*  HCT 36.4*  < > 30.3* 33.4*  MCV 90.8  < > 92.4 89.8  PLT 185  < > 162 183  < > = values in this interval not displayed. CBG:  Recent Labs Lab 03/18/15 2116 03/19/15 0739 03/19/15 1135 03/19/15 1737 03/19/15 2031 03/20/15 0734  GLUCAP 79 122* 208* 131* 179* 83   Coagulation:  Recent Labs Lab 03/17/15 0416 03/18/15 0645 03/19/15 0915 03/20/15 0548  LABPROT 29.6* 28.0* 25.0* 24.5*  INR 2.87* 2.66* 2.29* 2.23*   Urinalysis:  Recent Labs Lab 03/15/15 1649  COLORURINE YELLOW  LABSPEC 1.020  PHURINE 5.5  GLUCOSEU 250*  HGBUR SMALL*  BILIRUBINUR NEGATIVE  KETONESUR NEGATIVE  PROTEINUR 100*  NITRITE NEGATIVE  LEUKOCYTESUR NEGATIVE   Micro Results: Recent Results (from the past 240 hour(s))  Culture, blood (routine x 2)     Status: None (Preliminary result)   Collection Time: 03/15/15 10:20 PM  Result Value Ref Range Status   Specimen Description BLOOD RIGHT  ANTECUBITAL  Final   Special Requests BOTTLES DRAWN AEROBIC AND ANAEROBIC 5CC  Final   Culture NO GROWTH 4 DAYS  Final   Report Status PENDING  Incomplete  Culture, blood (routine x 2)     Status: None (Preliminary result)   Collection Time: 03/16/15 12:24 AM  Result Value Ref Range Status   Specimen Description BLOOD RIGHT ARM  Final   Special Requests BOTTLES DRAWN AEROBIC ONLY 10CC POF ZITHROMAX  Final   Culture NO GROWTH 3 DAYS  Final   Report Status PENDING  Incomplete  Culture, expectorated sputum-assessment     Status: None   Collection Time: 03/18/15  9:48 AM  Result Value Ref Range Status   Specimen Description SPUTUM  Final   Special Requests NONE  Final   Sputum evaluation   Final    MICROSCOPIC FINDINGS SUGGEST THAT THIS SPECIMEN IS NOT REPRESENTATIVE OF LOWER RESPIRATORY SECRETIONS. PLEASE RECOLLECT. RESULT CALLED TO, READ BACK BY AND VERIFIED WITH:  Narambas RN 13:15 03/18/15 ([email or web address])    Report Status 03/18/2015 FINAL  Final   Studies/Results: No results found. Medications: I have reviewed the patient's current medications. Scheduled Meds: . amLODipine  10 mg Oral Daily  . amoxicillin  500 mg Oral Q12H  . atorvastatin  40 mg Oral q1800  . azithromycin  500 mg Oral Daily  . cholecalciferol  1,000 Units Oral Daily  . ezetimibe  10 mg Oral Daily  . finasteride  5 mg Oral Daily  . fluticasone  2 spray Each Nare Daily  . furosemide  20 mg Oral BID  . guaiFENesin  600 mg Oral BID  . insulin aspart  0-15 Units Subcutaneous TID WC  . insulin aspart  0-5 Units Subcutaneous QHS  . insulin glargine  45 Units Subcutaneous Daily  . ipratropium-albuterol  3 mL Nebulization Q6H  . isosorbide mononitrate  60 mg Oral Daily  . metoprolol succinate  150 mg Oral Daily  . montelukast  10 mg Oral Daily  . pantoprazole  40 mg Oral Daily  . sodium chloride  3 mL Intravenous Q12H  . sodium chloride  3 mL Intravenous Q12H  . tamsulosin  0.4 mg Oral Daily  . warfarin  1 mg Oral q1800  . Warfarin - Pharmacist Dosing Inpatient   Does not apply q1800   Continuous Infusions:   PRN Meds:. Assessment/Plan: Active Problems:   Diaphragmatic paralysis   CAP (community acquired pneumonia)   Diabetes mellitus (HCC)   HTN (hypertension)   Atrial fibrillation (HCC)   Bilateral lower extremity edema   Chronic kidney disease (CKD) stage 3   Bilateral pleural effusion   Community acquired pneumonia   Hypoxia   Normocytic anemia   Chronic obstructive pulmonary disease (HCC)  Terrence [name] is an 81 year old man with a history of right hemidiaphragm paralysis, hypoxemia, chronic atrial fibrillation, hypertension, hyperlipidemia, stage III chronic kidney disease, and type 2 diabetes who presents with acute on chronic hypoxic respiratory failure.  Acute on chronic hypoxic respiratory failure  Patient with acutely worsening dyspnea over the past week with  mental status changes and severe oxygen desaturation. Imaging showing ground-glass opacities and small pleural effusions. Also showing multi-station lymphadenopathy in the mediastinum and bilateral hilar lymphadenopathy. Echo showing normal systolic function, mild LVH, mild aortic stenosis, mild mitral regurgitation, and moderate LA dilation. His hypoxia is likely secondary to an infectious process such as viral or atypical pneumonia. Cor pulmonale might also be playing a role as indicated by chronic venous insufficiency and stasis  dermatitis, however, echo did not report PA pressure. Patient reports not using his home oxygen regularly which could have lead to chronic hypoxemia. In addition, he has hypoxic respiratory failure due to shunt from R hemidiaphragm paralysis and R base consolidation. Acute worsening of patient's symptoms is likely related to an atypical CAP. His lung exam has improved significantly - no crackles heard. Trace pitting edema in bilateral lower extremities is likely due to venous insufficiency. Blood culture showing no growth in 4 days. Patient has been deescalated to 3L O2 via [place] and is satting 92-96%. He is stable and will be discharged to home today with continuous home O2 (3L).  -Continue home dose Lasix 20 mg BID -cont Amlodipine 10 mg daily to reduced afterload -cont Amoxicillin 1000 mg q8 x 5 days -cont Azithromycin 500 mg qd x 5 days -F/u final blood culture results   -Continue O2 target SpO2 > 85% -Continue home montelukast 10 mg by mouth -Duoneb q6 prn  Essential Hypertension: Blood pressure 109/72 today.  -cont Amlodipine 10 mg daily  -Lasix 20 mg PO BID -Imdur 60 mg PO -Toprol-XL 150 mg PO  Type 2 diabetes mellitus: Patient has high insulin requirements at home using 66 units Lantus at night, and insulin NovoLog 12 units with breakfast and 16 units with lunch and 42 units at dinnertime. CBG in the 83 this morning.  -Lantus 45U qhs -SSI-moderate  -CBGs TIDAC and  qhs  Chronic atrial fibrillation: Patient on long-term Coumadin anticoagulation with target INR of 2.0-3.0 for atrial fibrillation. INR 2.23 today.  -PT/INR -Continue Coumadin, pharmacy consulted for dosing  Normocytic anemia: Hgb 10.4 today with normal MCV. It was 12.1 in 11/2014 and 11.0 on admission. UA on admission showing small amount of Hgb but normal range RBCs 0-5. I spoke to the patient and he reports being chronically anemic. States he does not take an iron supplement at home. Patient denies having any hematemesis, hematochezia, melena, or hematuria. Anemia panel showing Ferritin 40 but iron low (40) and saturation ratio low (11).   CKD stage 3: Stable. SCr 1.9 today with baseline 1.7-1.8.   BPH: Continue home tamsulosin 0.4 mg, finasteride 5 mg  GERD: Continue home pantoprazole 40 mg PO  Hyperlipidemia: Continue home atorvastatin 40mg , ezetimibe 10mg   Diet: Carb modified  DVT ppx: Coumadin  Code: Full   Dispo: Disposition is deferred at this time, awaiting improvement of current medical problems.  Anticipated discharge in approximately 2-3 day(s).   The patient does have a current PCP (Kip A Corrington, MD) and [facility].  The patient does not have transportation limitations that hinder transportation to clinic appointments.  .Services Needed at time of discharge: Y = Yes, Blank = No PT:   OT:   RN:   Equipment:   Other:     LOS: 4 days   Terrence GiovanniVasundhra Edy Mcbane, MD 03/20/2015, 10:02 AM

## 2015-03-20 NOTE — Progress Notes (Signed)
Physical Therapy Treatment Patient Details Name: Terrence [name] MRN: [id number] DOB: [birth year] Today's Date: 03/20/2015    History of Present Illness Mr. 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     PT Comments    Continues to make progress with functional abilities. Ambulating up to 185 feet this AM using a rolling walker minimally for support. Mild dyspnea with SpO2 quickly dropping to 81-85% on 4L supplemental O2. Improves with standing rest break and pursed lip breathing. 92% SpO2 at rest on 2L. Patient will continue to benefit from skilled physical therapy services at home with HHPT to further improve strength, endurance, and independence with functional mobility.   Follow Up Recommendations  Home health PT;Supervision/Assistance - 24 hour     Equipment Recommendations  Rolling walker with 5" wheels    Recommendations for Other Services       Precautions / Restrictions Precautions Precautions: Fall Precaution Comments: SOB, desat with any mvmt Restrictions Weight Bearing Restrictions: No    Mobility  Bed Mobility               General bed mobility comments: sitting in recliner  Transfers Overall transfer level: Needs assistance Equipment used: Rolling walker (2 wheeled) Transfers: Sit to/from Stand Sit to Stand: Supervision         General transfer comment: Supervision for safety with use of RW upon standing. VC for hand placement with both transistion to sit and to stand.  Ambulation/Gait Ambulation/Gait assistance: Supervision Ambulation Distance  (Feet): 185 Feet Assistive device: Rolling walker (2 wheeled) Gait Pattern/deviations: Step-through pattern;Decreased stride length;Trunk flexed Gait velocity: decrased Gait velocity interpretation: Below normal speed for age/gender General Gait Details: Cues for upright posture, pursed lip breathing, and energy conservation techniques. SpO2 81-85% on 4L supplemental O2 while ambulating, up to 92% upon sitting with 2L applied. 2/4 dyspnea. Required 1 standing rest break to complete distance.   Stairs            Wheelchair Mobility    Modified Rankin (Stroke Patients Only)       Balance                                    Cognition Arousal/Alertness: Awake/alert Behavior During Therapy: WFL for tasks assessed/performed Overall Cognitive Status: Within Functional Limits for tasks assessed                      Exercises      General Comments General comments (skin integrity, edema, etc.): Pt sitting in chair with [place] of SpO2 79% when PT entered room. Educated on importance of keeping O2 on.       Pertinent Vitals/Pain Pain Assessment: No/denies pain    Home Living                      Prior Function            PT Goals (current goals can now be found in the care plan section) Acute Rehab PT Goals Patient Stated Goal: get better PT Goal Formulation: With patient Time For Goal  Achievement: 03/25/15 Potential to Achieve Goals: Good Progress towards PT goals: Progressing toward goals    Frequency  Min 3X/week    PT Plan Current plan remains appropriate    Co-evaluation             End of Session Equipment Utilized During Treatment: Gait belt;Oxygen Activity Tolerance: Patient tolerated treatment well Patient left: in chair;with call bell/phone within reach;with family/visitor present     Time: [id number] PT Time Calculation (min) (ACUTE ONLY): 20 min  Charges:  $Gait Training: 8-22 mins                    G Codes:       Berton MountBarbour, Shadrick Senne S 03/20/2015, 10:31 AM  Charlsie MerlesLogan Secor Tonique [place]onca, PT [phone]

## 2015-03-21 LAB — CULTURE, BLOOD (ROUTINE X 2): CULTURE: NO GROWTH

## 2015-03-29 ENCOUNTER — Emergency Department (HOSPITAL_COMMUNITY)
Admission: EM | Admit: 2015-03-29 | Discharge: 2015-04-30 | Disposition: E | Payer: Medicare Other | Attending: Emergency Medicine | Admitting: Emergency Medicine

## 2015-03-29 DIAGNOSIS — I10 Essential (primary) hypertension: Secondary | ICD-10-CM | POA: Diagnosis not present

## 2015-03-29 DIAGNOSIS — Z79899 Other long term (current) drug therapy: Secondary | ICD-10-CM | POA: Insufficient documentation

## 2015-03-29 DIAGNOSIS — Z792 Long term (current) use of antibiotics: Secondary | ICD-10-CM | POA: Insufficient documentation

## 2015-03-29 DIAGNOSIS — E119 Type 2 diabetes mellitus without complications: Secondary | ICD-10-CM | POA: Insufficient documentation

## 2015-03-29 DIAGNOSIS — E785 Hyperlipidemia, unspecified: Secondary | ICD-10-CM | POA: Insufficient documentation

## 2015-03-29 DIAGNOSIS — Z794 Long term (current) use of insulin: Secondary | ICD-10-CM | POA: Diagnosis not present

## 2015-03-29 DIAGNOSIS — N179 Acute kidney failure, unspecified: Secondary | ICD-10-CM | POA: Diagnosis not present

## 2015-03-29 DIAGNOSIS — Z87891 Personal history of nicotine dependence: Secondary | ICD-10-CM | POA: Insufficient documentation

## 2015-03-29 DIAGNOSIS — I469 Cardiac arrest, cause unspecified: Secondary | ICD-10-CM | POA: Insufficient documentation

## 2015-03-29 DIAGNOSIS — R079 Chest pain, unspecified: Secondary | ICD-10-CM | POA: Diagnosis present

## 2015-03-29 NOTE — ED Notes (Signed)
A large amount of family is in the consult room; Chaplain is with them and communicating with them.

## 2015-03-29 NOTE — ED Notes (Signed)
Family member called wanting update. Contact info. Is in chart.

## 2015-03-29 NOTE — Progress Notes (Signed)
The pt. Was brought into Trauma room A and was pronounced dead shortly after. I accompanied the physician as he notified. I offered prayer, support, and accompanied the family into room 26 in the ED to view the pt. Helped the nurse gather information i.e. Phone numbers, funeral home info. Etc.

## 2015-03-29 NOTE — ED Provider Notes (Signed)
CSN: [id number]     Arrival date & time 16-Apr-2015  1753 History   First MD Initiated Contact with Patient 04-16-2015 1757     Chief Complaint  Patient presents with  . Cardiac Arrest     (Consider location/radiation/quality/duration/timing/severity/associated sxs/prior Treatment) HPI Comments: Level 5 exception of care due to acuity of condition.  Patient is a 81 y.o. male presenting with general illness.  Illness Location:  Home Quality:  Unresponsive Severity:  Severe Onset quality:  Sudden Timing:  Constant Progression:  Unchanged Chronicity:  New Context:  Pt had recent pneumonia and had complained of SOB today and found down pulseless after last seen 10 minutes prior Associated symptoms: cough and shortness of breath   Associated symptoms: no diarrhea and no vomiting     Past Medical History  Diagnosis Date  . Atrial fibrillation (HCC)   . Hypertension   . DM (diabetes mellitus) (HCC)   . Kidney failure   . Hyperlipidemia    Past Surgical History  Procedure Laterality Date  . Turp vaporization    . Coronary angioplasty with stent placement     Family History  Problem Relation Age of Onset  . Heart disease Brother   . Cancer - Lung Brother   . Colon cancer Father     with mets to liver  . Diabetes Mellitus II Mother   . Stroke Mother   . Heart disease Brother    Social History  Substance Use Topics  . Smoking status: Former Smoker -- 1.25 packs/day for 22 years    Types: Cigarettes    Quit date: 03/30/1967  . Smokeless tobacco: Never Used  . Alcohol Use: No    Review of Systems  Unable to perform ROS: Acuity of condition  Respiratory: Positive for cough and shortness of breath.   Gastrointestinal: Negative for vomiting and diarrhea.      Allergies  Review of patient's allergies indicates no known allergies.  Home Medications   Prior to Admission medications   Medication Sig Start Date End Date Taking? Authorizing Provider  amLODipine  (NORVASC) 5 MG tablet Take 5 mg by mouth daily.   Yes Historical Provider, MD  enalapril (VASOTEC) 20 MG tablet Take 20 mg by mouth daily.   Yes Historical Provider, MD  finasteride (PROSCAR) 5 MG tablet 5 mg. 10/25/12  Yes Historical Provider, MD  furosemide (LASIX) 20 MG tablet Take 20 mg by mouth 2 (two) times daily.  11/07/12  Yes Historical Provider, MD  guaiFENesin (MUCINEX) 600 MG 12 hr tablet Take 1 tablet (600 mg total) by mouth 2 (two) times daily. 03/20/15  Yes Kees Giovanni, MD  isosorbide mononitrate (IMDUR) 60 MG 24 hr tablet 60 mg. 06/01/12  Yes Historical Provider, MD  lansoprazole (PREVACID) 30 MG capsule Take 30 mg by mouth daily.  02/01/13 04-16-15 Yes Historical Provider, MD  montelukast (SINGULAIR) 10 MG tablet Take 10 mg by mouth daily. 03/03/15 03/02/16 Yes Historical Provider, MD  tamsulosin (FLOMAX) 0.4 MG CAPS capsule Take 0.4 mg by mouth daily. 01/23/15  Yes Historical Provider, MD  amLODipine-olmesartan (AZOR) 5-20 MG per tablet Take 1 tablet by mouth daily. 03/30/13   Nyoka Cowden, MD  ammonium lactate (LAC-HYDRIN) 12 % lotion As directed 12/30/12   Historical Provider, MD  amoxicillin (AMOXIL) 500 MG capsule Take 1 capsule (500 mg total) by mouth every 12 (twelve) hours. 03/20/15   Maliik Giovanni, MD  atorvastatin (LIPITOR) 40 MG tablet Take 40 mg by mouth daily.  06/01/12  Historical Provider, MD  azithromycin (ZITHROMAX) 500 MG tablet Take 1 tablet (500 mg total) by mouth daily. 03/20/15   Oluwanifemi Giovanni, MD  Cholecalciferol 10000 UNITS CAPS Take 1 capsule by mouth daily.    Historical Provider, MD  diazepam (VALIUM) 5 MG tablet Take 5 mg by mouth every 8 (eight) hours as needed.  04/02/14 04/02/15  Historical Provider, MD  insulin aspart (NOVOLOG) 100 UNIT/ML injection Inject before meals as follows: 16 units before breakfast, 12 at lunch and  42 at supper 11/03/12   Historical Provider, MD  insulin glargine (LANTUS) 100 UNIT/ML injection Inject 66 Units into the skin at  bedtime. 05/30/12   Historical Provider, MD  Insulin Pen Needle (B-D ULTRAFINE III SHORT PEN) 31G X 8 MM MISC USE THREE TIMES A DAY 12/20/14   Historical Provider, MD  ipratropium-albuterol (DUONEB) 0.5-2.5 (3) MG/3ML SOLN Take 3 mLs by nebulization every 6 (six) hours. 03/20/15   Jaisean Giovanni, MD  lactobacillus (FLORANEX/LACTINEX) PACK Take 1 packet (1 g total) by mouth 3 (three) times daily with meals. Sprinkle on meals 12/13/14   Francee Piccolo, PA-C  metoprolol succinate (TOPROL XL) 50 MG 24 hr tablet 50 mg. 10/25/12 03/15/15  Historical Provider, MD  metoprolol succinate (TOPROL-XL) 100 MG 24 hr tablet Take 1 tablet by mouth daily. Total of 150 mg a day 10/25/12   Historical Provider, MD  nitroGLYCERIN (NITROSTAT) 0.4 MG SL tablet Place 0.4 mg under the tongue as needed. Chest pain 04/01/14 04/01/15  Historical Provider, MD  Letta Pate DELICA LANCETS 33G MISC 1 EACH BY OTHER ROUTE 3 (THREE) TIMES A DAY. 12/20/14   Historical Provider, MD  warfarin (COUMADIN) 1 MG tablet As directed 10/20/12   Historical Provider, MD   Wt 113.399 kg Physical Exam  Constitutional: He appears well-developed and well-nourished. He has a sickly appearance. He appears ill. He is intubated Brooke Dare airway in place).  HENT:  Head: Head is without abrasion, without contusion and without laceration.  Eyes: Conjunctivae are normal. Right pupil is not reactive. Left pupil is not reactive.  Neck: Neck supple.  Cardiovascular:  No pulses appreciated.  Pt in asystole.  Pulmonary/Chest: He is intubated Brooke Dare airway in place).  Abdominal: Normal appearance. He exhibits no distension. There is no rigidity and no guarding.  Neurological: He is unresponsive. GCS eye subscore is 1. GCS verbal subscore is 1. GCS motor subscore is 1.  Skin: No rash noted.  Psychiatric: He is noncommunicative.    ED Course  Procedures (including critical care time) Labs Review Labs Reviewed - No data to display  Imaging Review No results  found. I have personally reviewed and evaluated these images and lab results as part of my medical decision-making.   EKG Interpretation None      MDM   Final diagnoses:  Cardiac arrest [facility])    81 year old Caucasian gentleman with numerous comorbidities presents the setting of cardiac arrest. Per EMS and family report patient walked back to bedroom today. Wife did not notice any movement for 10-15 minutes when she checked on patient he was unresponsive. EMS was called and on arrival patient found to be in asystole. ACLS protocol initiated and patient had continuous CPR. Patient given 9 rounds of epinephrine and IV fluids. Patient noted at one point during CPR process to have PE a arrest. Patient did not have any pulses during this process.  On arrival to emergency department patient had been 50 minutes without pulses. Patient had [facility] airway in place with bilateral breath sounds.  Patient had end-tidal CO2 within normal limits. At this time due to cardiac arrest with no pulses for approximately 50 minutes and no neurologic response time of death was called. Family advised of death.  Attending has seen and evaluated patient and Dr. Ranae PalmsYelverton is in agreement with plan.    Stacy GardnerAndrew Yajahira Tison, MD 02/28/2015 [id number]  Loren Raceravid Yelverton, MD 04/05/15 2056

## 2015-03-29 NOTE — ED Notes (Signed)
Witnessed arrest an hour ago; asystole on arrival. 9 epi and narcan in route; recently discharged after admission for double pneumonia. EMS working for 55 minutes, PEA for last 30 minutes, no return of pulse.

## 2015-03-30 DEATH — deceased

## 2015-04-08 ENCOUNTER — Ambulatory Visit: Payer: Medicare Other | Admitting: Podiatry

## 2015-04-30 DEATH — deceased

## 2017-10-04 IMAGING — CT CT CHEST W/O CM
2 of 4 series · 15 of 36 positions shown, 18 images · non-contrast
Comparison: Chest x-ray 03/15/2015, CT chest 04/05/2013

CLINICAL DATA: 88-year-old male with a history of shortness of
breath. Home oxygen.

EXAM:
CT CHEST WITHOUT CONTRAST
TECHNIQUE: Multidetector CT imaging of the chest was performed following the
standard protocol without IV contrast.

[Series 8: lung 3mm · axial · 0.79mm/px · z∈[-546,-294]mm · 12 of 97 slices shown, 15 images]
[im 7/97  mediastinal]
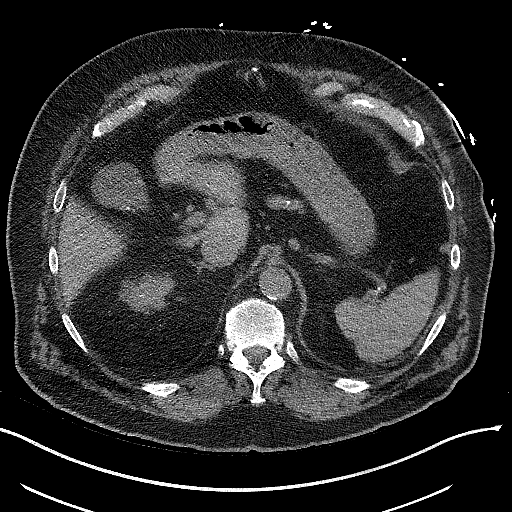
[im 7/97  lung]
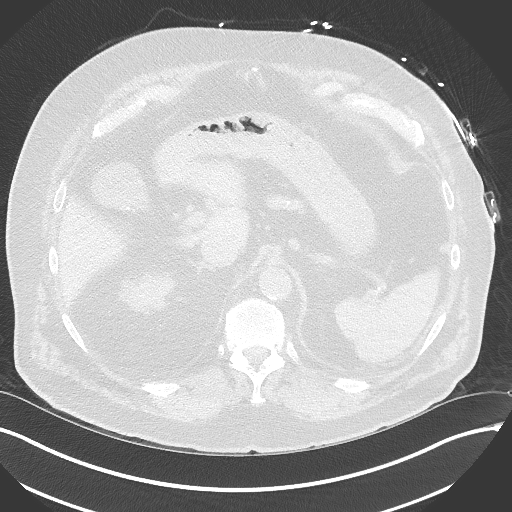
[im 13/97  lung]
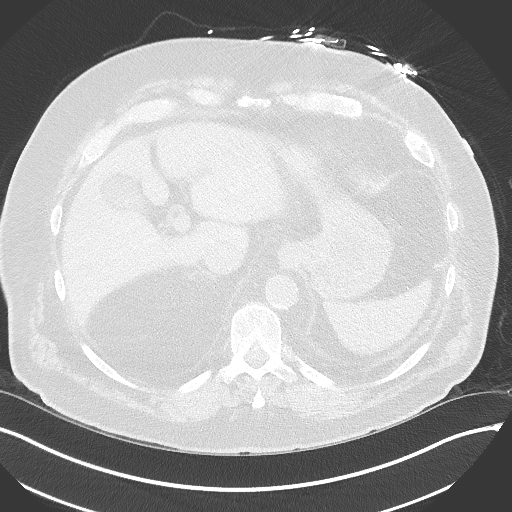
[im 25/97  lung]
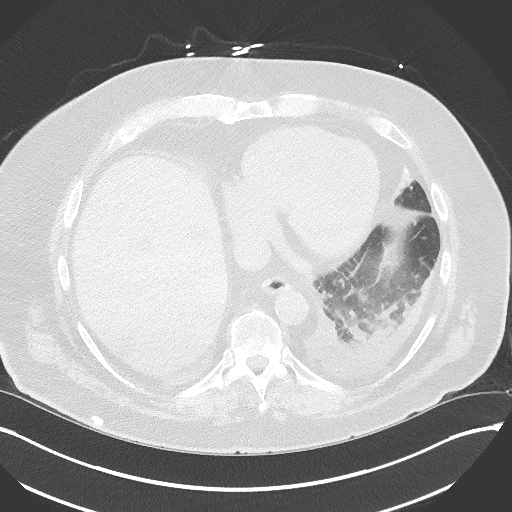
[im 31/97  lung]
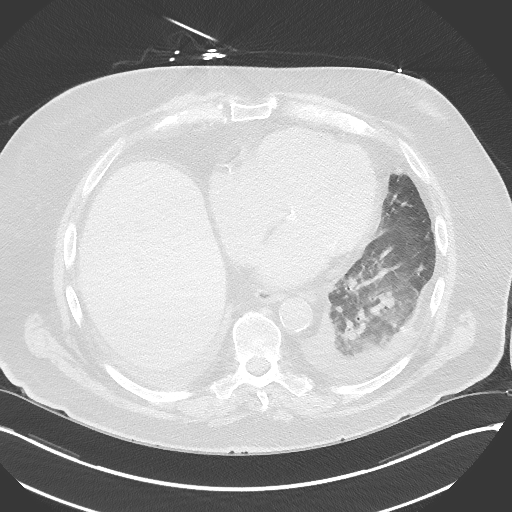
[im 37/97  mediastinal]
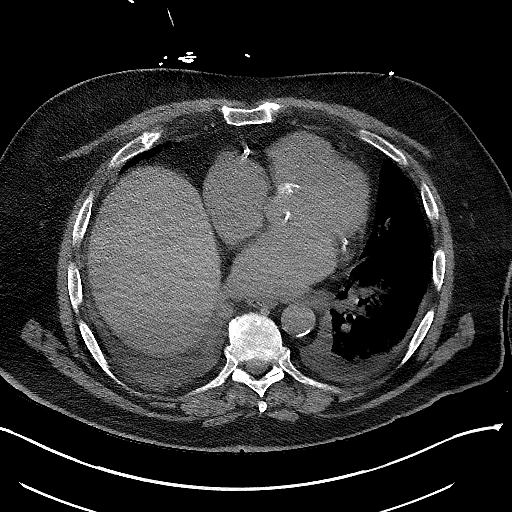
[im 37/97  lung]
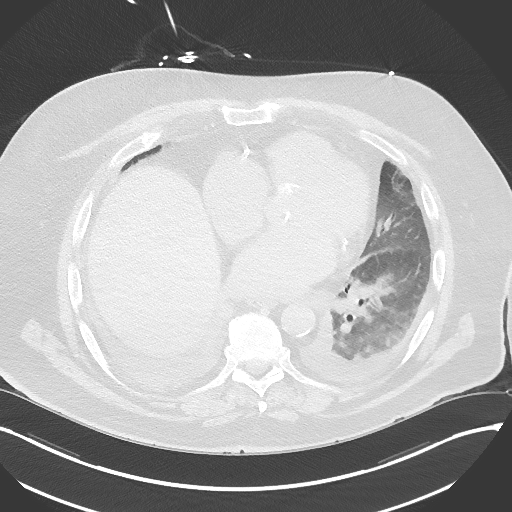
[im 43/97  lung]
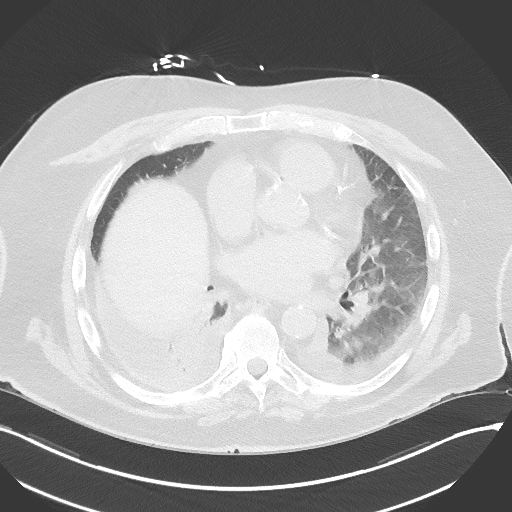
[im 55/97  lung]
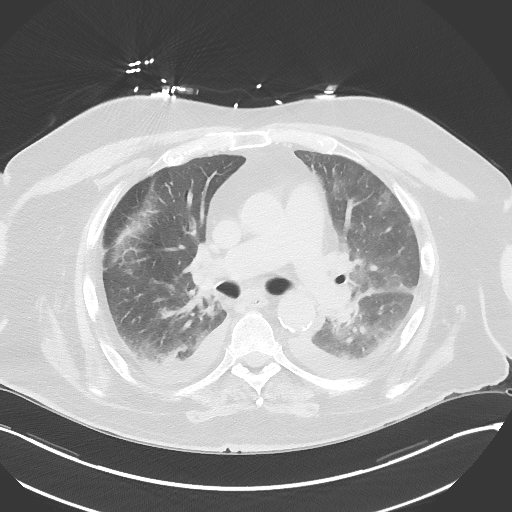
[im 61/97  lung]
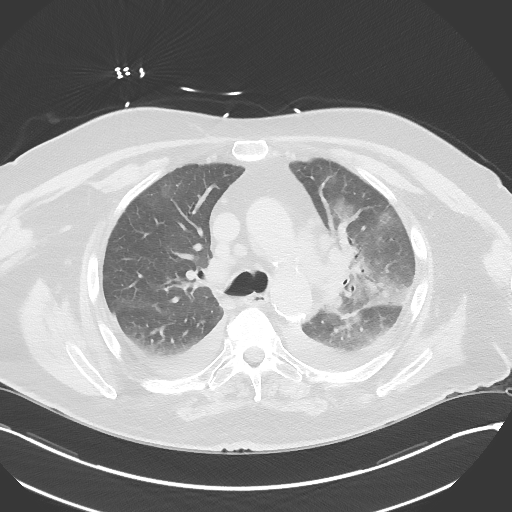
[im 67/97  mediastinal]
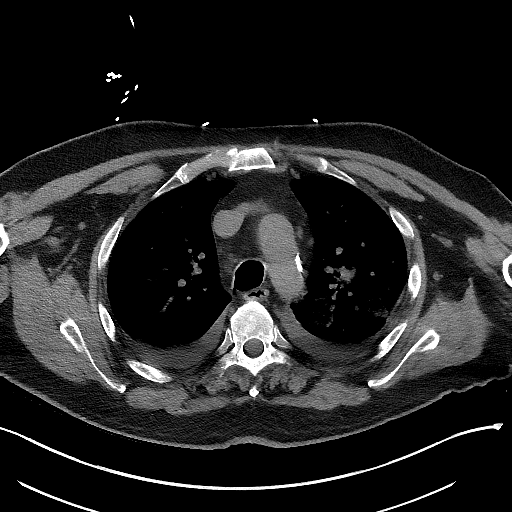
[im 67/97  lung]
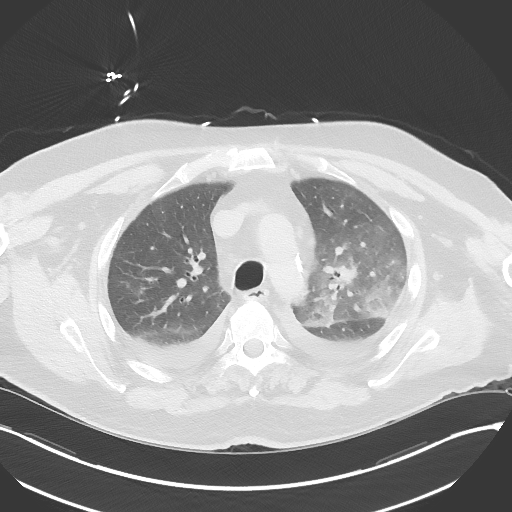
[im 73/97  lung]
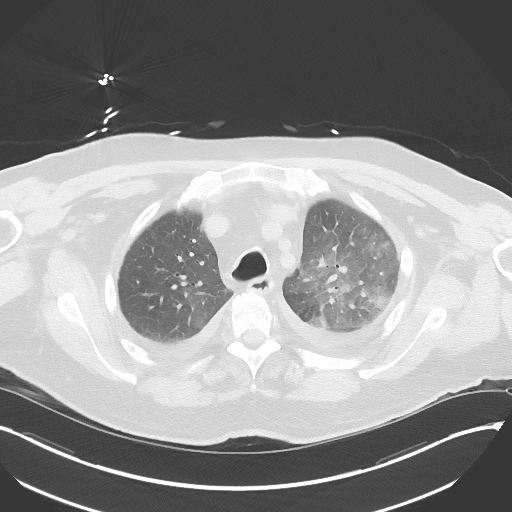
[im 85/97  lung]
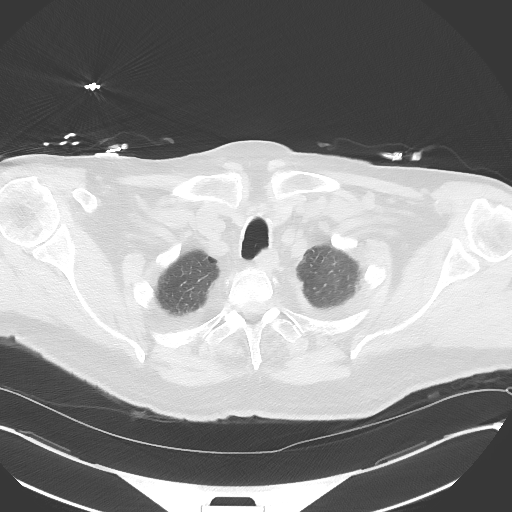
[im 91/97  lung]
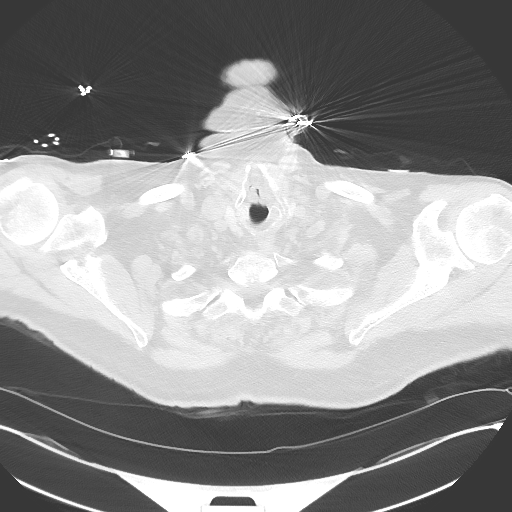

[Series 11: st coronal 3mm · coronal · 0.65mm/px · 3 of 94 slices shown]
[im 19/94  lung]
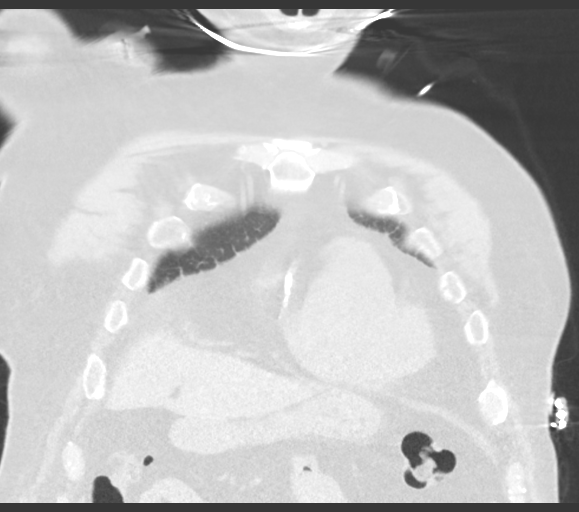
[im 38/94  lung]
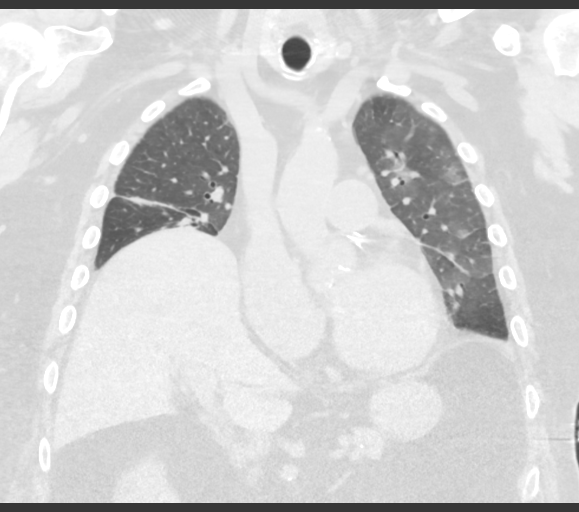
[im 56/94  lung]
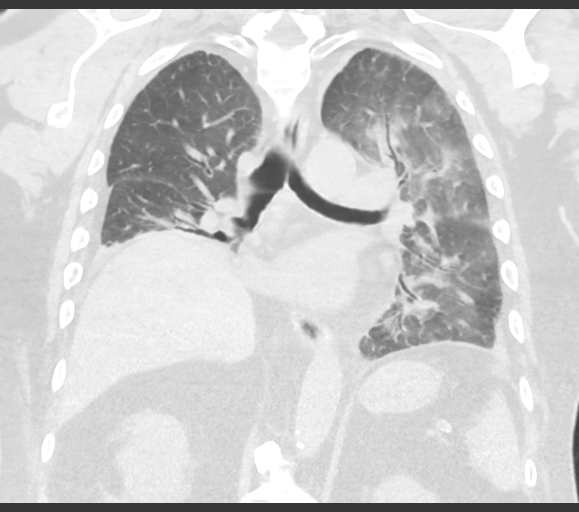

[15 of 36 positions shown; findings below may reference images not displayed]

FINDINGS: Chest:

Unremarkable appearance the superficial soft tissues.

No axillary or supraclavicular adenopathy.

Unremarkable appearance of the thoracic inlet.

Central airways are clear. No tracheal or proximal endobronchial
debris.

Unremarkable appearance of the esophagus.

Global cardiomegaly. Calcifications of the aortic valve, mitral
annulus, left main, left anterior descending, circumflex, and right
coronary arteries.

No pericardial fluid/ thickening.

Compared to the prior CT, there are enlarged lymph nodes throughout
the nodal stations of the mediastinum. Lymph nodes also present
within the bilateral hilar regions, more pronounced on the left.

Unremarkable course caliber and contour of the thoracic aorta with
no aneurysm. Calcifications of the aortic arch and descending
thoracic aorta. Calcifications of the branch vessels.

Multifocal airspace opacities involving predominantly the left upper
lobe and left lower lobe, and to a lesser degree the right upper
lobe, right middle lobe, and right lower lobe. Moderate bilateral
pleural effusions, including fluid within the left fissure. Mild
interlobular septal thickening, more pronounced within the left
upper lobe and left lower lobe. Atelectatic changes bilateral lower
lobes, more pronounced on the right.

Upper abdomen:

Unremarkable appearance of liver and spleen.

Cholelithiasis.

Calcifications of the abdominal aorta.

Musculoskeletal:

No displaced fracture.  Degenerative changes of the thoracic spine.
IMPRESSION: Left greater than right airspace disease and associated moderate
pleural effusions, most concerning for multifocal pneumonia with
reactive adenopathy and parapneumonic effusions. Superimposed
edema/CHF not excluded, although the pattern of interstitial disease
is favored to be infectious/inflammatory.

Atherosclerosis with associated left main and 3 vessel coronary
artery disease.

## 2017-10-05 IMAGING — DX DG CHEST 2V
3 series · 3 of 3 positions shown · non-contrast
Comparison: None.

CLINICAL DATA: Community acquired pneumonia

EXAM:
CHEST  2 VIEW

[x chest ap]
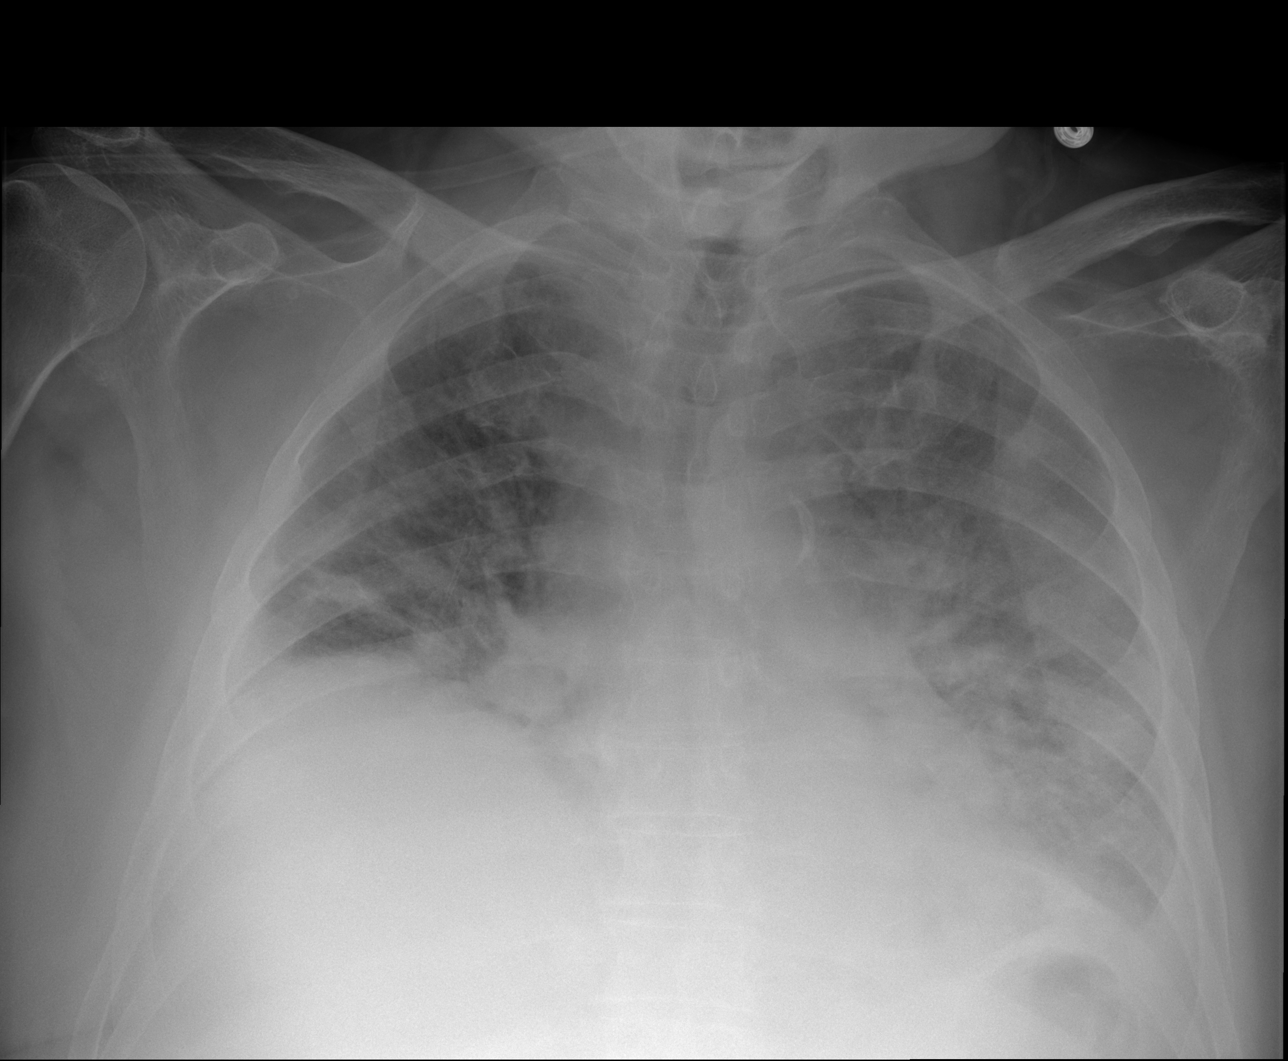

[w chest lat (1 of 2)]
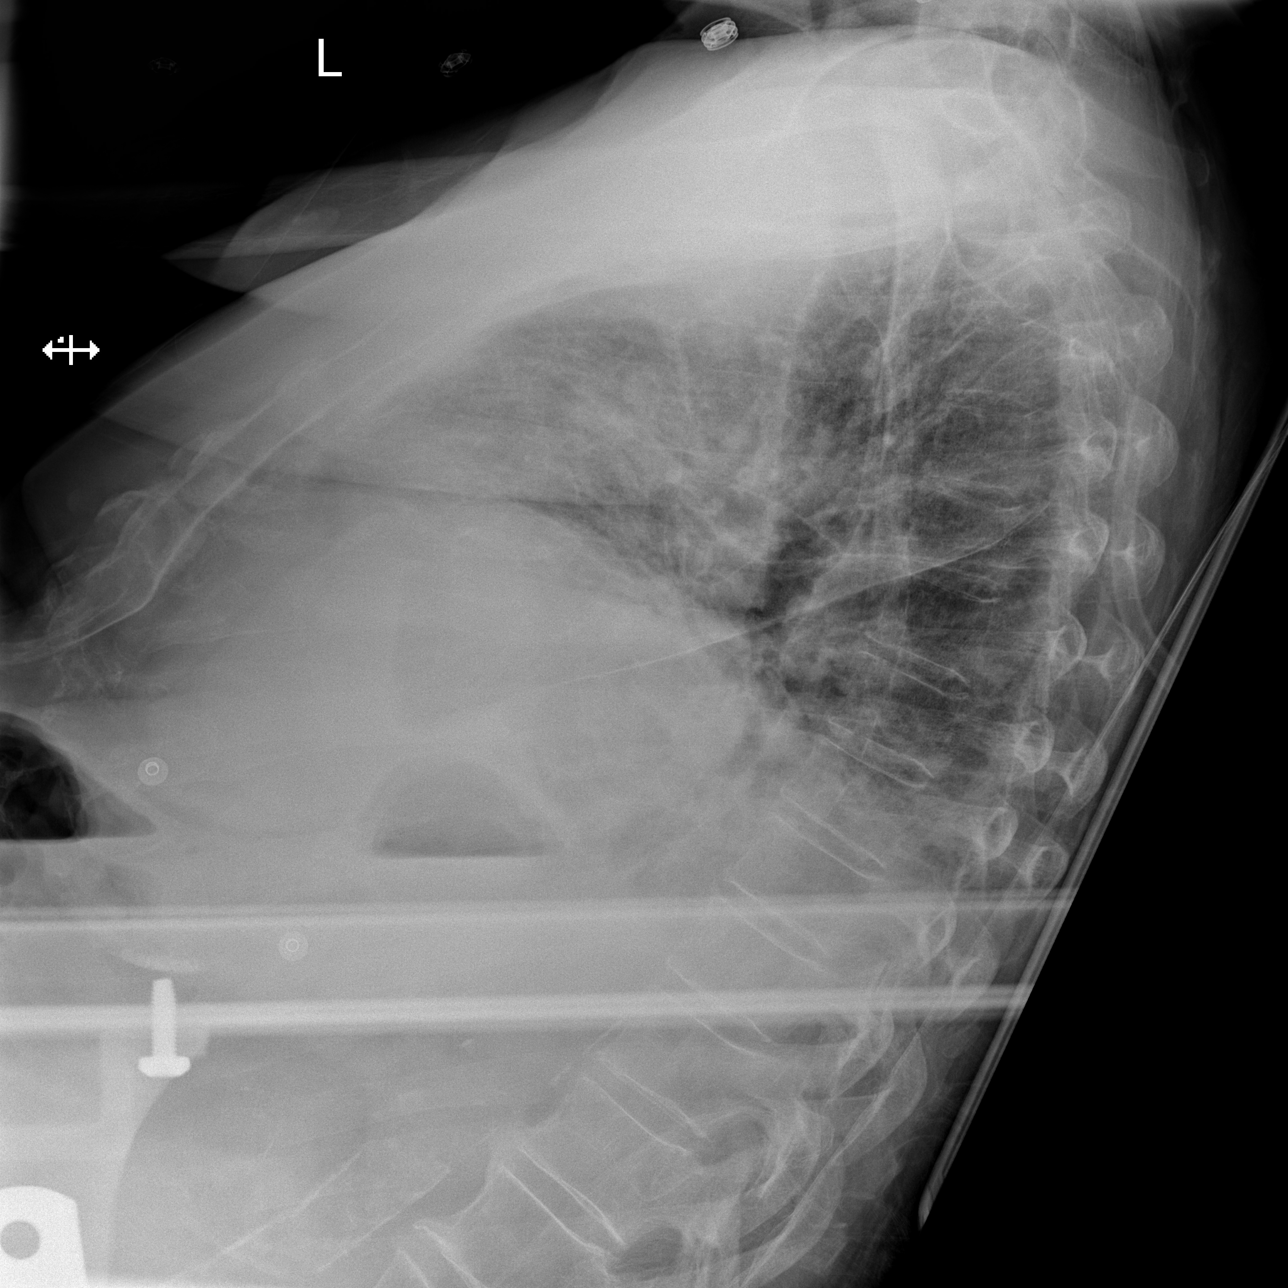

[w chest lat (2 of 2)]
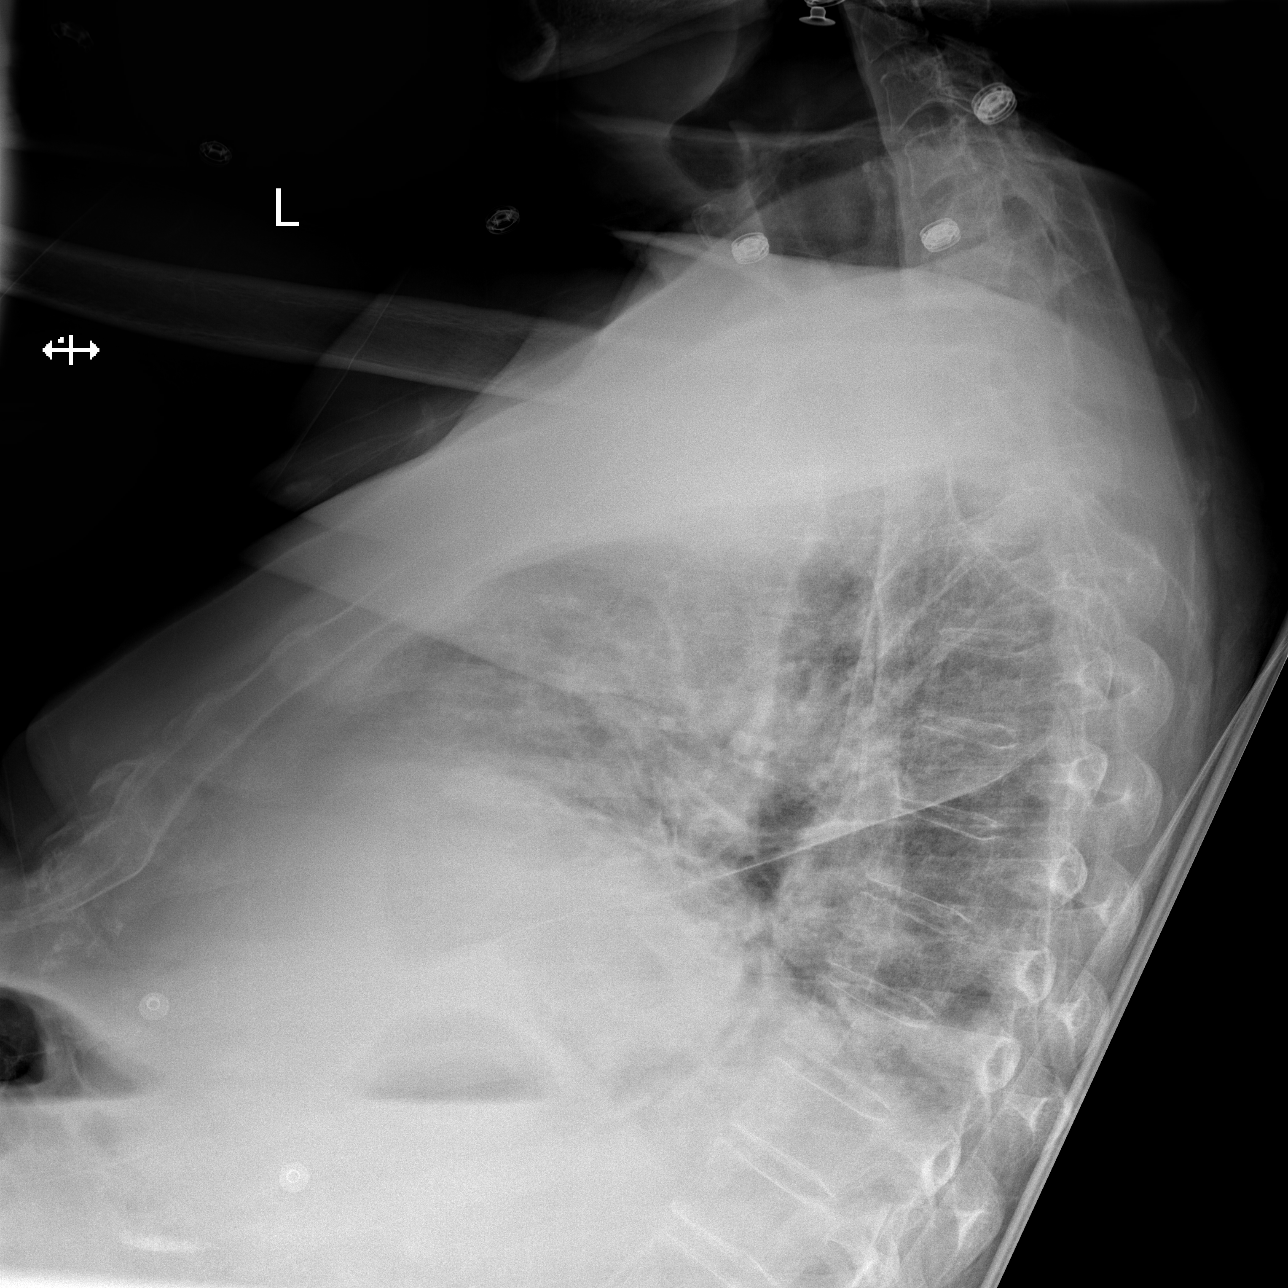

[3 of 3 positions shown; findings below may reference images not displayed]

FINDINGS: Upper normal heart size. Vascular congestion. Bilateral patchy
airspace disease in a pulmonary edema pattern. No pneumothorax.
IMPRESSION: Vascular congestion and bilateral airspace disease in a pulmonary
edema pattern. The heart is upper normal in size.
